# Patient Record
Sex: Female | Born: 1960 | Race: Black or African American | Hispanic: No | Marital: Single | Smoking: Never smoker
Health system: Southern US, Community
[De-identification: ages and names within clinical notes are randomized; demographics above are authoritative.]

## PROBLEM LIST (undated history)

## (undated) DIAGNOSIS — E119 Type 2 diabetes mellitus without complications: Secondary | ICD-10-CM

## (undated) DIAGNOSIS — E785 Hyperlipidemia, unspecified: Secondary | ICD-10-CM

## (undated) DIAGNOSIS — I6522 Occlusion and stenosis of left carotid artery: Secondary | ICD-10-CM

## (undated) DIAGNOSIS — I10 Essential (primary) hypertension: Secondary | ICD-10-CM

## (undated) HISTORY — DX: Type 2 diabetes mellitus without complications: E11.9

## (undated) HISTORY — PX: ABDOMINAL HYSTERECTOMY: SUR658

## (undated) HISTORY — DX: Essential (primary) hypertension: I10

## (undated) HISTORY — DX: Occlusion and stenosis of left carotid artery: I65.22

## (undated) HISTORY — DX: Hyperlipidemia, unspecified: E78.5

---

## 2015-06-28 ENCOUNTER — Encounter: Payer: Self-pay | Admitting: Neurology

## 2015-06-28 ENCOUNTER — Ambulatory Visit (INDEPENDENT_AMBULATORY_CARE_PROVIDER_SITE_OTHER): Payer: Commercial Managed Care - PPO | Admitting: Neurology

## 2015-06-28 VITALS — BP 120/82 | HR 66 | Ht 67.0 in | Wt 205.0 lb

## 2015-06-28 DIAGNOSIS — R42 Dizziness and giddiness: Secondary | ICD-10-CM | POA: Diagnosis not present

## 2015-06-28 DIAGNOSIS — I10 Essential (primary) hypertension: Secondary | ICD-10-CM | POA: Insufficient documentation

## 2015-06-28 NOTE — Progress Notes (Signed)
See consult note

## 2015-06-28 NOTE — Patient Instructions (Signed)
It is not vertigo.  The symptoms do not sound neurologic.  Symptoms of lightheadedness and feeling of going to pass out is typically not neurologic.  I would recommend that Dr. Lysle Rubens investigate other causes.

## 2015-06-28 NOTE — Consult Note (Signed)
NEUROLOGY CONSULTATION NOTE  Ariel Taylor MRN: [id number] DOB: [birth year]  Referring provider: Dr. Lysle Rubens Primary care provider: Dr. Lysle Rubens  Reason for consult:  dizziness  HISTORY OF PRESENT ILLNESS: Ariel Taylor is a 54 year old right-handed female with hypertension who presents for dizziness.  Symptoms started about a year ago.  At that time, she was diagnosed with vertigo and was prescribed meclizine.  It is ineffective.  She reports episodes with sudden onset feeling that she is going to pass out.  Except for a couple of brief instances in the car, there is no associated spinning sensation.  It is not positional and will occur spontaneously.  There is no diaphoresis, palpitations, tunnel vision, headache or focal numbness or weakness.  It occurs off an on for a period of a week.  Over the past year, it has occurred about 5 or 6 times.  Sometimes, she notes a pressure behind the right side of her head, but it is not associated with the spells.  She also reports ringing in her ears at times.  PAST MEDICAL HISTORY: Past Medical History  Diagnosis Date  . Hypertension     PAST SURGICAL HISTORY: Past Surgical History  Procedure Laterality Date  . Abdominal hysterectomy      MEDICATIONS: No current outpatient prescriptions on file prior to visit.   No current facility-administered medications on file prior to visit.    ALLERGIES: Allergies  Allergen Reactions  . Acetaminophen     FAMILY HISTORY: Family History  Problem Relation Age of Onset  . Diabetes      SOCIAL HISTORY: Social History   Social History  . Marital Status: Single    Spouse Name: N/A  . Number of Children: N/A  . Years of Education: N/A   Occupational History  . Not on file.   Social History Main Topics  . Smoking status: Never Smoker   . Smokeless tobacco: Never Used  . Alcohol Use: No  . Drug Use: No  . Sexual Activity: Not on file   Other Topics Concern  . Not on file    Social History Narrative  . No narrative on file    REVIEW OF SYSTEMS: Constitutional: No fevers, chills, or sweats, no generalized fatigue, change in appetite Eyes: No visual changes, double vision, eye pain Ear, nose and throat: No hearing loss, ear pain, nasal congestion, sore throat Cardiovascular: No chest pain, palpitations Respiratory:  No shortness of breath at rest or with exertion, wheezes GastrointestinaI: No nausea, vomiting, diarrhea, abdominal pain, fecal incontinence Genitourinary:  No dysuria, urinary retention or frequency Musculoskeletal:  No neck pain, back pain Integumentary: No rash, pruritus, skin lesions Neurological: as above Psychiatric: No depression, insomnia, anxiety Endocrine: No palpitations, fatigue, diaphoresis, mood swings, change in appetite, change in weight, increased thirst Hematologic/Lymphatic:  No anemia, purpura, petechiae. Allergic/Immunologic: no itchy/runny eyes, nasal congestion, recent allergic reactions, rashes  PHYSICAL EXAM: Filed Vitals:   06/28/15 0752  BP: 120/82  Pulse: 66  Supine 128/80, HR 76bpm; Sitting 120/80, 82bpm; Standing 128/84, 84bpm General: No acute distress.  Patient appears well-groomed. Head:  Normocephalic/atraumatic Eyes:  fundi unremarkable, without vessel changes, exudates, hemorrhages or papilledema. Neck: supple, no paraspinal tenderness, full range of motion Back: No paraspinal tenderness Heart: regular rate and rhythm Lungs: Clear to auscultation bilaterally. Vascular: No carotid bruits. Neurological Exam: Mental status: alert and oriented to person, place, and time, recent and remote memory intact, fund of knowledge intact, attention and concentration intact, speech fluent and not dysarthric,  language intact. Cranial nerves: CN I: not tested CN II: pupils equal, round and reactive to light, visual fields intact, fundi unremarkable, without vessel changes, exudates, hemorrhages or papilledema. CN  III, IV, VI:  full range of motion, no nystagmus, no ptosis CN V: facial sensation intact CN VII: upper and lower face symmetric CN VIII: hearing intact CN IX, X: gag intact, uvula midline CN XI: sternocleidomastoid and trapezius muscles intact CN XII: tongue midline Bulk & Tone: normal, no fasciculations. Motor:  5/5 throughout Sensation: temperature and vibration sensation intact. Deep Tendon Reflexes:  2+ throughout, toes downgoing.  Finger to nose testing:  Without dysmetria.  Heel to shin:  Without dysmetria.  Gait:  Normal station and stride.  Able to turn and tandem walk. Romberg negative.  IMPRESSION: Lightheadedness/pre-syncope.  Symptoms do not sound neurologic.  She does not describe any focal neurologic symptoms associated with spells. Her exam is normal.  Orthostatics are negative.  It is not vertigo.  It does not seem like seizure or migraine.  She reports separate posterior head pressure at times, which are separate from her spells and they really sound like tension-type rather than intracranial abnormality.  I do not suspect cerebrovascular etiology.  I have no neurologic explanation for her symptoms and her symptoms do not help direct me in looking for a neurologic etiology.  Therefore, I have no cause, based on her symptoms and exam, to pursue further testing such as brain MRI.  I would recommend focusing investigation on other causes, such as cardiac.  Thank you for allowing me to take part in the care of this patient.  45 minutes spent face to face with patient, over 50% spent discussing diagnosis.  Metta Clines, DO  CC:  Wenda Low, MD

## 2015-06-30 ENCOUNTER — Ambulatory Visit: Payer: Self-pay | Admitting: Neurology

## 2016-05-17 ENCOUNTER — Other Ambulatory Visit: Payer: Self-pay | Admitting: Internal Medicine

## 2016-05-17 DIAGNOSIS — Z1231 Encounter for screening mammogram for malignant neoplasm of breast: Secondary | ICD-10-CM

## 2016-05-26 ENCOUNTER — Ambulatory Visit: Payer: Commercial Managed Care - PPO

## 2016-06-05 ENCOUNTER — Ambulatory Visit: Payer: Commercial Managed Care - PPO

## 2016-06-15 ENCOUNTER — Ambulatory Visit
Admission: RE | Admit: 2016-06-15 | Discharge: 2016-06-15 | Disposition: A | Discharge status: Home / Self Care | Payer: Commercial Managed Care - PPO | Source: Ambulatory Visit | Attending: Internal Medicine | Admitting: Internal Medicine

## 2016-06-15 DIAGNOSIS — Z1231 Encounter for screening mammogram for malignant neoplasm of breast: Secondary | ICD-10-CM

## 2018-01-07 ENCOUNTER — Other Ambulatory Visit: Payer: Self-pay | Admitting: Internal Medicine

## 2018-01-07 DIAGNOSIS — Z1231 Encounter for screening mammogram for malignant neoplasm of breast: Secondary | ICD-10-CM

## 2018-01-30 ENCOUNTER — Ambulatory Visit
Admission: RE | Admit: 2018-01-30 | Discharge: 2018-01-30 | Disposition: A | Discharge status: Home / Self Care | Payer: Commercial Managed Care - PPO | Source: Ambulatory Visit | Attending: Internal Medicine | Admitting: Internal Medicine

## 2018-01-30 DIAGNOSIS — Z1231 Encounter for screening mammogram for malignant neoplasm of breast: Secondary | ICD-10-CM | POA: Diagnosis not present

## 2018-05-06 DIAGNOSIS — Z1159 Encounter for screening for other viral diseases: Secondary | ICD-10-CM | POA: Diagnosis not present

## 2018-05-06 DIAGNOSIS — Z1389 Encounter for screening for other disorder: Secondary | ICD-10-CM | POA: Diagnosis not present

## 2018-05-06 DIAGNOSIS — E7800 Pure hypercholesterolemia, unspecified: Secondary | ICD-10-CM | POA: Diagnosis not present

## 2018-05-06 DIAGNOSIS — E1169 Type 2 diabetes mellitus with other specified complication: Secondary | ICD-10-CM | POA: Diagnosis not present

## 2018-05-06 DIAGNOSIS — Z23 Encounter for immunization: Secondary | ICD-10-CM | POA: Diagnosis not present

## 2018-05-06 DIAGNOSIS — Z0000 Encounter for general adult medical examination without abnormal findings: Secondary | ICD-10-CM | POA: Diagnosis not present

## 2018-07-15 DIAGNOSIS — M1712 Unilateral primary osteoarthritis, left knee: Secondary | ICD-10-CM | POA: Diagnosis not present

## 2018-07-15 DIAGNOSIS — M25562 Pain in left knee: Secondary | ICD-10-CM | POA: Diagnosis not present

## 2018-08-05 DIAGNOSIS — M25562 Pain in left knee: Secondary | ICD-10-CM | POA: Diagnosis not present

## 2018-11-07 ENCOUNTER — Other Ambulatory Visit: Payer: Self-pay | Admitting: Internal Medicine

## 2019-06-12 ENCOUNTER — Other Ambulatory Visit: Payer: Self-pay | Admitting: Internal Medicine

## 2019-06-12 DIAGNOSIS — Z1231 Encounter for screening mammogram for malignant neoplasm of breast: Secondary | ICD-10-CM

## 2019-06-13 ENCOUNTER — Other Ambulatory Visit: Payer: Self-pay

## 2019-06-13 ENCOUNTER — Ambulatory Visit
Admission: RE | Admit: 2019-06-13 | Discharge: 2019-06-13 | Disposition: A | Discharge status: Home / Self Care | Payer: Commercial Managed Care - PPO | Source: Ambulatory Visit | Attending: Internal Medicine | Admitting: Internal Medicine

## 2019-06-13 DIAGNOSIS — Z1231 Encounter for screening mammogram for malignant neoplasm of breast: Secondary | ICD-10-CM

## 2019-11-22 ENCOUNTER — Ambulatory Visit: Payer: Commercial Managed Care - PPO | Attending: Internal Medicine

## 2019-11-22 DIAGNOSIS — Z23 Encounter for immunization: Secondary | ICD-10-CM | POA: Insufficient documentation

## 2019-11-22 NOTE — Progress Notes (Signed)
   Covid-19 Vaccination Clinic  Name:  Ariel Taylor    MRN: [id number] DOB: 1961/06/22  11/22/2019  [name] was observed post Covid-19 immunization for 15 minutes without incidence. She was provided with Vaccine Information Sheet and instruction to access the V-Safe system.   [name] was instructed to call 911 with any severe reactions post vaccine: Marland Kitchen Difficulty breathing  . Swelling of your face and throat  . A fast heartbeat  . A bad rash all over your body  . Dizziness and weakness    Immunizations Administered    Name Date Dose VIS Date Route   Pfizer COVID-19 Vaccine 11/22/2019  6:45 PM 0.3 mL 09/05/2019 Intramuscular   Manufacturer: [place]   Lot: [id number]   [place]: [id number]

## 2019-12-13 ENCOUNTER — Ambulatory Visit: Payer: Commercial Managed Care - PPO | Attending: Internal Medicine

## 2019-12-13 DIAGNOSIS — Z23 Encounter for immunization: Secondary | ICD-10-CM

## 2019-12-13 NOTE — Progress Notes (Signed)
   Covid-19 Vaccination Clinic  Name:  Ariel Taylor    MRN: [id number] DOB: Jul 07, 1961  12/13/2019  [name] was observed post Covid-19 immunization for 15 minutes without incident. She was provided with Vaccine Information Sheet and instruction to access the V-Safe system.   [name] was instructed to call 911 with any severe reactions post vaccine: Marland Kitchen Difficulty breathing  . Swelling of face and throat  . A fast heartbeat  . A bad rash all over body  . Dizziness and weakness   Immunizations Administered    Name Date Dose VIS Date Route   Pfizer COVID-19 Vaccine 12/13/2019  4:47 PM 0.3 mL 09/05/2019 Intramuscular   Manufacturer: [place]   Lot: [id number]   [place]: [id number]

## 2020-12-30 ENCOUNTER — Other Ambulatory Visit: Payer: Self-pay | Admitting: Internal Medicine

## 2020-12-30 DIAGNOSIS — Z1231 Encounter for screening mammogram for malignant neoplasm of breast: Secondary | ICD-10-CM

## 2021-02-22 ENCOUNTER — Other Ambulatory Visit: Payer: Self-pay

## 2021-02-22 ENCOUNTER — Ambulatory Visit
Admission: RE | Admit: 2021-02-22 | Discharge: 2021-02-22 | Disposition: A | Discharge status: Home / Self Care | Payer: Commercial Managed Care - PPO | Source: Ambulatory Visit | Attending: Internal Medicine | Admitting: Internal Medicine

## 2021-02-22 DIAGNOSIS — Z1231 Encounter for screening mammogram for malignant neoplasm of breast: Secondary | ICD-10-CM

## 2021-03-01 ENCOUNTER — Other Ambulatory Visit: Payer: Self-pay

## 2021-03-01 ENCOUNTER — Ambulatory Visit: Payer: Commercial Managed Care - PPO | Admitting: Cardiology

## 2021-03-01 ENCOUNTER — Encounter: Payer: Self-pay | Admitting: Cardiology

## 2021-03-01 VITALS — BP 150/71 | HR 74 | Temp 98.1°F | Resp 17 | Ht 67.0 in | Wt 187.8 lb

## 2021-03-01 DIAGNOSIS — E782 Mixed hyperlipidemia: Secondary | ICD-10-CM

## 2021-03-01 DIAGNOSIS — I10 Essential (primary) hypertension: Secondary | ICD-10-CM

## 2021-03-01 DIAGNOSIS — R0989 Other specified symptoms and signs involving the circulatory and respiratory systems: Secondary | ICD-10-CM

## 2021-03-01 DIAGNOSIS — R42 Dizziness and giddiness: Secondary | ICD-10-CM

## 2021-03-01 DIAGNOSIS — R002 Palpitations: Secondary | ICD-10-CM

## 2021-03-01 DIAGNOSIS — R9431 Abnormal electrocardiogram [ECG] [EKG]: Secondary | ICD-10-CM

## 2021-03-01 DIAGNOSIS — E119 Type 2 diabetes mellitus without complications: Secondary | ICD-10-CM

## 2021-03-01 NOTE — Progress Notes (Signed)
Date:  03/01/2021   ID:  Ariel Taylor, DOB 1961/03/18, MRN [id number]  PCP:  Wenda Low, MD  Cardiologist:  Rex Kras, DO, [facility] (established care March 01, 2021)  REASON FOR CONSULT: Palpitations  REQUESTING PHYSICIAN:  Wenda Low, MD [address] [place],  [place] [address]  Chief Complaint  Patient presents with  . New Patient (Initial Visit)  . Dizziness  . Palpitations    Ref by Wenda Low    HPI  Ariel Taylor is a 60 y.o. female who presents to the office with a chief complaint of " palpitations, dizziness." Patient's past medical history and cardiovascular risk factors include: Benign essential hypertension, hyperlipidemia, non-insulin-dependent diabetes mellitus type 2, postmenopausal female.   She is referred to the office at the request of Wenda Low, MD for evaluation of palpitations and dizziness.  Patient states that she is here to be evaluated for her ongoing dizziness and palpitations.  Patient plans to move to [place] and would like to complete the work-up prior to her moving.  Dizziness: Symptoms have been going on for at least 1 year, intermittent, occurs at least couple times a week and lasts for a few seconds.  They can occur while at rest, moving or changing positions.  No syncopal events.  Patient states that she does have a history of vertigo but the symptoms are not consistent with her known vertigo.  No improving or worsening factors.  No additional work-up performed prior to establishing care with myself.  Palpitations: Patient states that they have been occurring separately, lasts for few seconds, occurring once a month, not associated with dizziness or passing out.  No improving or worsening factors.  Patient is caffeine consumption includes half a cup of coffee on a daily basis, half cup of green tea, and soda on as needed basis. No use of new over-the-counter medications, weight loss supplements, stimulants,  recreational drugs, marijuana, or energy drinks.  She takes both of her antihypertensive medications at night.  Recently diagnosed with diabetes and started on metformin and does not have any GI upset.  No family history of premature coronary disease or sudden cardiac death.  FUNCTIONAL STATUS: Golfs 5 days a week and also plays tennis 1 day a week.  Patient stated she tries to walk the golf course on a daily basis.  ALLERGIES: Allergies  Allergen Reactions  . Acetaminophen     MEDICATION LIST PRIOR TO VISIT: Current Meds  Medication Sig  . amLODipine (NORVASC) 5 MG tablet Take 5 mg by mouth daily.  Marland Kitchen lisinopril (PRINIVIL,ZESTRIL) 10 MG tablet Take 10 mg by mouth daily.  . metFORMIN (GLUCOPHAGE) 500 MG tablet Take 1 tablet by mouth 2 (two) times daily.  . rosuvastatin (CRESTOR) 5 MG tablet Take 5 mg by mouth daily.     PAST MEDICAL HISTORY: Past Medical History:  Diagnosis Date  . Diabetes mellitus without complication ([place])   . Hyperlipidemia   . Hypertension     PAST SURGICAL HISTORY: Past Surgical History:  Procedure Laterality Date  . ABDOMINAL HYSTERECTOMY      FAMILY HISTORY: The patient family history includes Diabetes in her mother and another family member.  SOCIAL HISTORY:  The patient  reports that she has never smoked. She has never used smokeless tobacco. She reports that she does not drink alcohol and does not use drugs.  REVIEW OF SYSTEMS: Review of Systems  Constitutional: Negative for chills and fever.  HENT: Negative for hoarse voice and nosebleeds.  Eyes: Negative for discharge, double vision and pain.  Cardiovascular: Positive for palpitations. Negative for chest pain, claudication, dyspnea on exertion, leg swelling, near-syncope, orthopnea, paroxysmal nocturnal dyspnea and syncope.  Respiratory: Negative for hemoptysis and shortness of breath.   Musculoskeletal: Negative for muscle cramps and myalgias.  Gastrointestinal: Negative for abdominal  pain, constipation, diarrhea, hematemesis, hematochezia, melena, nausea and vomiting.  Neurological: Positive for dizziness. Negative for light-headedness.    PHYSICAL EXAM: Vitals with BMI 03/01/2021 06/28/2015  Height 5\' 7"  5\' 7"   Weight 187 lbs 13 oz 205 lbs  BMI 03.50 09.3  Systolic 818 299  Diastolic 71 82  Pulse 74 66   Orthostatic VS for the past 72 hrs (Last 3 readings):  Orthostatic BP Patient Position BP Location Cuff Size Orthostatic Pulse  03/01/21 1148 143/81 Standing Left Arm Large 93  03/01/21 1147 150/72 Sitting Left Arm Large 86  03/01/21 1144 156/77 Supine Left Arm Large 84   CONSTITUTIONAL: Well-developed and well-nourished. No acute distress.  SKIN: Skin is warm and dry. No rash noted. No cyanosis. No pallor. No jaundice HEAD: Normocephalic and atraumatic.  EYES: No scleral icterus MOUTH/THROAT: Moist oral membranes.  NECK: No JVD present. No thyromegaly noted. Left carotid bruits  LYMPHATIC: No visible cervical adenopathy.  CHEST Normal respiratory effort. No intercostal retractions  LUNGS: Clear to auscultation bilaterally.  No stridor. No wheezes. No rales.  CARDIOVASCULAR: Regular rate and rhythm, positive S1-S2, no murmurs rubs or gallops appreciated ABDOMINAL: No apparent ascites.  EXTREMITIES: No peripheral edema  HEMATOLOGIC: No significant bruising NEUROLOGIC: Oriented to person, place, and time. Nonfocal. Normal muscle tone.  PSYCHIATRIC: Normal mood and affect. Normal behavior. Cooperative  CARDIAC DATABASE: EKG: 03/01/2021: Normal sinus rhythm, 83 bpm, normal axis, nonspecific ST-T changes, left atrial enlargement, without underlying injury pattern.   Echocardiogram: No results found for this or any previous visit from the past 1095 days.   Stress Testing: No results found for this or any previous visit from the past 1095 days.  Heart Catheterization: None   LABORATORY DATA: No flowsheet data found.  No flowsheet data found.  Lipid Panel   No results found for: CHOL, TRIG, HDL, CHOLHDL, VLDL, LDLCALC, LDLDIRECT, LABVLDL  No components found for: NTPROBNP No results for input(s): PROBNP in the last 8760 hours. No results for input(s): TSH in the last 8760 hours.  BMP No results for input(s): NA, K, CL, CO2, GLUCOSE, BUN, CREATININE, CALCIUM, GFRNONAA, GFRAA in the last 8760 hours.  HEMOGLOBIN A1C No results found for: HGBA1C, MPG  IMPRESSION:    ICD-10-CM   1. Palpitations  R00.2 EKG 12-Lead  2. Dizziness  R42   3. Left carotid bruit  R09.89 PCV CAROTID DUPLEX (BILATERAL)  4. Non-insulin dependent type 2 diabetes mellitus ([place])  E11.9 PCV MYOCARDIAL PERFUSION WO LEXISCAN  5. Essential hypertension  I10 PCV ECHOCARDIOGRAM COMPLETE    PCV MYOCARDIAL PERFUSION WO LEXISCAN  6. Mixed hyperlipidemia  E78.2   7. Abnormal EKG  R94.31 PCV MYOCARDIAL PERFUSION WO LEXISCAN     RECOMMENDATIONS: Judiann Celia is a 60 y.o. female whose past medical history and cardiac risk factors include: Benign essential hypertension, hyperlipidemia, non-insulin-dependent diabetes mellitus type 2, postmenopausal female.   Palpitations/dizziness: Present but relatively stable. Occurs sporadically We did discuss proceeding with a monitor for 14 days but since her episodes are so sporadic it may not be beneficial unless if her symptoms worsen.  Patient is agreeable and would like to hold off on the monitor for now.  Abnormal EKG:  EKG shows normal sinus rhythm with diffuse ST-T changes most likely secondary to left ventricular hypertrophy but underlying ischemia cannot be ruled out.   Discussed findings with the patient at today's visit and given her multiple cardiovascular risk factors including diabetes we discussed proceeding with an ischemic evaluation.   Echocardiogram will be ordered to evaluate for structural heart disease and left ventricular systolic function.   Plan exercise nuclear stress test.  Exercise nuclear stress test is  preferred given baseline ST-T changes.  Left carotid bruit: Check carotid duplex to evaluate for carotid artery atherosclerosis/stenosis  Benign essential hypertension: Currently being managed by primary care provider. Patient states that she takes all her antihypertensive medications at night. I have asked her to take lisinopril in the morning and amlodipine at night.  She is an avid Medical illustrator and I have encouraged her to keep her self well-hydrated to prevent episodes of dizziness which may be secondary to dehydration.  Continue to monitor for now.  Patient was recently diagnosed with diabetes mellitus type 2 and was started on metformin and statin therapy.  From a cardiovascular standpoint encouraged improving her modifiable cardiovascular risk factors including better blood pressure management, glycemic and lipid management, and continuing her physical activity of moderate intensity for at least 30 minutes a day 5 days a week as tolerated.  FINAL MEDICATION LIST END OF ENCOUNTER: No orders of the defined types were placed in this encounter.   There are no discontinued medications.   Current Outpatient Medications:  .  amLODipine (NORVASC) 5 MG tablet, Take 5 mg by mouth daily., Disp: , Rfl:  .  lisinopril (PRINIVIL,ZESTRIL) 10 MG tablet, Take 10 mg by mouth daily., Disp: , Rfl:  .  metFORMIN (GLUCOPHAGE) 500 MG tablet, Take 1 tablet by mouth 2 (two) times daily., Disp: , Rfl:  .  rosuvastatin (CRESTOR) 5 MG tablet, Take 5 mg by mouth daily., Disp: , Rfl:   Orders Placed This Encounter  Procedures  . PCV MYOCARDIAL PERFUSION WO LEXISCAN  . EKG 12-Lead  . PCV ECHOCARDIOGRAM COMPLETE  . PCV CAROTID DUPLEX (BILATERAL)    There are no Patient Instructions on file for this visit.   --Continue cardiac medications as reconciled in final medication list. --Return in about 3 weeks (around 03/22/2021) for Follow up, Palpitations, Review test results. Or sooner if  needed. --Continue follow-up with your primary care physician regarding the management of your other chronic comorbid conditions.  Patient's questions and concerns were addressed to her satisfaction. She voices understanding of the instructions provided during this encounter.   This note was created using a voice recognition software as a result there may be grammatical errors inadvertently enclosed that do not reflect the nature of this encounter. Every attempt is made to correct such errors.  Rex Kras, [place], [facility]  Pager: [phone] Office: [phone]

## 2021-03-10 ENCOUNTER — Other Ambulatory Visit: Payer: Self-pay

## 2021-03-10 ENCOUNTER — Ambulatory Visit: Payer: Commercial Managed Care - PPO

## 2021-03-10 DIAGNOSIS — E119 Type 2 diabetes mellitus without complications: Secondary | ICD-10-CM

## 2021-03-10 DIAGNOSIS — I10 Essential (primary) hypertension: Secondary | ICD-10-CM

## 2021-03-10 DIAGNOSIS — R9431 Abnormal electrocardiogram [ECG] [EKG]: Secondary | ICD-10-CM

## 2021-03-10 DIAGNOSIS — R0989 Other specified symptoms and signs involving the circulatory and respiratory systems: Secondary | ICD-10-CM

## 2021-03-21 ENCOUNTER — Other Ambulatory Visit: Payer: Commercial Managed Care - PPO

## 2021-03-21 NOTE — Progress Notes (Signed)
Called pt no answer, left a vm to return the call back

## 2021-03-21 NOTE — Progress Notes (Signed)
Spoke to patient she voiced understanding

## 2021-03-23 ENCOUNTER — Ambulatory Visit: Payer: Commercial Managed Care - PPO

## 2021-03-23 ENCOUNTER — Other Ambulatory Visit: Payer: Self-pay

## 2021-03-30 NOTE — Progress Notes (Signed)
Called patient, NA, LMAM

## 2021-03-31 ENCOUNTER — Telehealth: Payer: Self-pay | Admitting: Hematology and Oncology

## 2021-03-31 NOTE — Telephone Encounter (Signed)
Received a new hem referral from Dr. Lysle Rubens for elevated platelets and anemia. Ariel Taylor returned my call and has been scheduled to see Dr. Chryl Heck on 7/13 at 10:40am. Pt aware to arrive 20 minutes early.

## 2021-03-31 NOTE — Progress Notes (Signed)
Pt aware.

## 2021-04-04 ENCOUNTER — Ambulatory Visit: Payer: Commercial Managed Care - PPO | Admitting: Cardiology

## 2021-04-05 ENCOUNTER — Other Ambulatory Visit: Payer: Self-pay

## 2021-04-05 ENCOUNTER — Ambulatory Visit: Payer: Commercial Managed Care - PPO | Admitting: Cardiology

## 2021-04-05 ENCOUNTER — Encounter: Payer: Self-pay | Admitting: Cardiology

## 2021-04-05 VITALS — BP 154/80 | HR 114 | Temp 98.2°F | Resp 16 | Ht 67.0 in | Wt 187.8 lb

## 2021-04-05 DIAGNOSIS — E119 Type 2 diabetes mellitus without complications: Secondary | ICD-10-CM

## 2021-04-05 DIAGNOSIS — E782 Mixed hyperlipidemia: Secondary | ICD-10-CM

## 2021-04-05 DIAGNOSIS — R9431 Abnormal electrocardiogram [ECG] [EKG]: Secondary | ICD-10-CM

## 2021-04-05 DIAGNOSIS — R42 Dizziness and giddiness: Secondary | ICD-10-CM

## 2021-04-05 DIAGNOSIS — R002 Palpitations: Secondary | ICD-10-CM

## 2021-04-05 DIAGNOSIS — R0989 Other specified symptoms and signs involving the circulatory and respiratory systems: Secondary | ICD-10-CM

## 2021-04-05 DIAGNOSIS — I6522 Occlusion and stenosis of left carotid artery: Secondary | ICD-10-CM

## 2021-04-05 NOTE — Progress Notes (Signed)
Date:  04/05/2021   ID:  [name], DOB Jul 26, 1961, MRN [id number]  PCP:  Wenda Low, MD  Cardiologist:  Rex Kras, DO, [facility] (established care March 01, 2021)  Date: 04/05/21 Last Office Visit: 03/01/2021  Chief Complaint  Patient presents with   Palpitations   Results   Follow-up    5 weeks      HPI  Ariel Taylor is a 60 y.o. female who presents to the office with a chief complaint of " reevaluate symptoms of palpitation and discuss test results." Patient's past medical history and cardiovascular risk factors include: Benign essential hypertension, hyperlipidemia, non-insulin-dependent diabetes mellitus type 2, postmenopausal female.   She is referred to the office at the request of Wenda Low, MD for evaluation of palpitations and dizziness.  Patient initially presented to the office for evaluation of palpitations and dizziness back in June 2022.  Clinically patient's palpitations had overall improved and were infrequent and therefore the shared decision was to hold off on proceeding with a Holter monitor as it would be overall a low yield study.  Since last visit patient states that the symptoms of palpitation are essentially resolved and very infrequently present.  With regards to dizziness patient's orthostatic vital signs were negative.  I encouraged her to take some of her antihypertensive medications in the morning and others at night.  Since then patient states that her symptoms of dizziness has also essentially resolved.  She brings in her home blood pressure log for review.  Her systolic blood pressures are ranging around 125-135 mmHg.  She was also noted to have a left carotid bruit and underwent a carotid duplex and notes very mild atherosclerotic burden involving the left ICA.  Given her new diagnosis of diabetes, EKG findings and other cardiovascular risk factors she underwent an echocardiogram and stress test.  Echocardiogram notes preserved LVEF and  stress test overall a low risk study.  Details reviewed with her in great detail and noted below for further reference.  Patient denies any active chest pain or shortness of breath at rest or with effort related activities.  No recent hospitalizations for cardiovascular symptoms.  No family history of premature coronary disease or sudden cardiac death.  FUNCTIONAL STATUS: Golfs 5 days a week and also plays tennis 1 day a week.  Patient stated she tries to walk the golf course on a daily basis.  ALLERGIES: Allergies  Allergen Reactions   Acetaminophen     MEDICATION LIST PRIOR TO VISIT: Current Meds  Medication Sig   amLODipine (NORVASC) 5 MG tablet Take 5 mg by mouth daily.   lisinopril (PRINIVIL,ZESTRIL) 10 MG tablet Take 10 mg by mouth daily.   metFORMIN (GLUCOPHAGE) 500 MG tablet Take 1 tablet by mouth 2 (two) times daily.   rosuvastatin (CRESTOR) 5 MG tablet Take 5 mg by mouth daily.     PAST MEDICAL HISTORY: Past Medical History:  Diagnosis Date   Diabetes mellitus without complication ([place])    Hyperlipidemia    Hypertension     PAST SURGICAL HISTORY: Past Surgical History:  Procedure Laterality Date   ABDOMINAL HYSTERECTOMY      FAMILY HISTORY: The patient family history includes Diabetes in her mother and another family member.  SOCIAL HISTORY:  The patient  reports that she has never smoked. She has never used smokeless tobacco. She reports that she does not drink alcohol and does not use drugs.  REVIEW OF SYSTEMS: Review of Systems  Constitutional: Negative for chills and fever.  HENT:  Negative for hoarse voice and nosebleeds.   Eyes:  Negative for discharge, double vision and pain.  Cardiovascular:  Positive for palpitations (stable). Negative for chest pain, claudication, dyspnea on exertion, leg swelling, near-syncope, orthopnea, paroxysmal nocturnal dyspnea and syncope.  Respiratory:  Negative for hemoptysis and shortness of breath.   Musculoskeletal:   Negative for muscle cramps and myalgias.  Gastrointestinal:  Negative for abdominal pain, constipation, diarrhea, hematemesis, hematochezia, melena, nausea and vomiting.  Neurological:  Negative for dizziness and light-headedness.   PHYSICAL EXAM: Vitals with BMI 04/05/2021 04/05/2021 03/01/2021  Height - 5\' 7"  5\' 7"   Weight - 187 lbs 13 oz 187 lbs 13 oz  BMI - 09.60 45.40  Systolic 981 191 478  Diastolic 80 77 71  Pulse 295 111 74   CONSTITUTIONAL: Well-developed and well-nourished. No acute distress.  SKIN: Skin is warm and dry. No rash noted. No cyanosis. No pallor. No jaundice HEAD: Normocephalic and atraumatic.  EYES: No scleral icterus MOUTH/THROAT: Moist oral membranes.  NECK: No JVD present. No thyromegaly noted. Left carotid bruits  LYMPHATIC: No visible cervical adenopathy.  CHEST Normal respiratory effort. No intercostal retractions  LUNGS: Clear to auscultation bilaterally.  No stridor. No wheezes. No rales.  CARDIOVASCULAR: Regular rate and rhythm, positive S1-S2, no murmurs rubs or gallops appreciated ABDOMINAL: No apparent ascites.  EXTREMITIES: No peripheral edema  HEMATOLOGIC: No significant bruising NEUROLOGIC: Oriented to person, place, and time. Nonfocal. Normal muscle tone.  PSYCHIATRIC: Normal mood and affect. Normal behavior. Cooperative  CARDIAC DATABASE: EKG: 03/01/2021: Normal sinus rhythm, 83 bpm, normal axis, nonspecific ST-T changes, left atrial enlargement, without underlying injury pattern.   Echocardiogram: 03/10/2021:  Normal LV systolic function with visual EF 60-65%. Left ventricle cavity is normal in size. Mild left ventricular hypertrophy. Normal global wall motion. Unable to evaluate diastolic function.  Left atrial cavity is mildly dilated.  No prior study for comparison.   Stress Testing: Exercise Sestamibi stress test 03/23/2021: Exercise nuclear stress test was performed using Bruce protocol. Patient reached 7.9 METS, and 89% of age  predicted maximum heart rate. Exercise capacity was fair. No chest pain reported. Heart rate and hemodynamic response were normal. Stress EKG revealed equivocal ischemic changes. Normal myocardial perfusion. Stress LVEF 82%. Low risk study.  Heart Catheterization: None   Carotid artery duplex 03/10/2021:  Doppler velocity suggests stenosis in the right external carotid artery (<50%).  Doppler velocity suggests stenosis in the left internal carotid artery  (1-15%).  Minimal plaque noted.  Doppler velocity suggests stenosis in the left external carotid artery (<50%).  Antegrade right vertebral artery flow. Antegrade left vertebral artery flow.  External carotid artery stenosis may be the source of bruit. Follow up is appropriate if clinically indicated.  LABORATORY DATA: No flowsheet data found.  No flowsheet data found.  Lipid Panel  No results found for: CHOL, TRIG, HDL, CHOLHDL, VLDL, LDLCALC, LDLDIRECT, LABVLDL  No components found for: NTPROBNP No results for input(s): PROBNP in the last 8760 hours. No results for input(s): TSH in the last 8760 hours.  BMP No results for input(s): NA, K, CL, CO2, GLUCOSE, BUN, CREATININE, CALCIUM, GFRNONAA, GFRAA in the last 8760 hours.  HEMOGLOBIN A1C No results found for: HGBA1C, MPG  IMPRESSION:    ICD-10-CM   1. Palpitations  R00.2     2. Dizziness  R42     3. Mild atherosclerosis of carotid artery, left  I65.22 PCV CAROTID DUPLEX (BILATERAL)    4. Non-insulin dependent type 2 diabetes mellitus ([place])  E11.9     5. Mixed hyperlipidemia  E78.2     6. Abnormal EKG  R94.31        RECOMMENDATIONS: Yula Crotwell is a 60 y.o. female whose past medical history and cardiac risk factors include: Benign essential hypertension, hyperlipidemia, non-insulin-dependent diabetes mellitus type 2, postmenopausal female.   Palpitations/dizziness: Infrequently present, almost resolved Shared decision is to hold off on proceeding with a  Holter monitor at this time as the symptoms are very infrequent.  Abnormal EKG: Given her EKG findings and multiple cardiovascular risk factors including new onset of diabetes mellitus patient underwent an ischemic evaluation overall unremarkable results were reviewed with her in great detail and noted above for further reference. No additional testing warranted at this time. She is encouraged to improve her modifiable cardiovascular risk factors which include blood pressure, lipid, and glycemic management.  Left carotid artery atherosclerosis: Noted to have left carotid bruit on physical examination and underwent a carotid duplex. Noted to have very mild atherosclerosis involving the left ICA. She is already on statin therapy given her underlying diabetes. Repeat carotid duplex in 1 year to reevaluate disease progression.  Benign essential hypertension: Office blood pressures are currently not at goal. Home blood pressures are within acceptable range as discussed above. Currently being managed by primary care provider. Recommend a goal blood pressure of 130-80 mmHg if able to tolerate. Her symptoms of dizziness have improved significantly after taking some of her antihypertensive medications in the morning and evening.   Low-salt diet recommended.    I will see the patient on an annual basis or sooner if needed.  Patient is advised to have her yearly physical first and to bring her labs and at the next office visit for review/reference  Patient will be moving to [place] shortly but would like to keep her providers in [place] as she will be coming in to town in the summer months.  FINAL MEDICATION LIST END OF ENCOUNTER: No orders of the defined types were placed in this encounter.   There are no discontinued medications.   Current Outpatient Medications:    amLODipine (NORVASC) 5 MG tablet, Take 5 mg by mouth daily., Disp: , Rfl:    lisinopril (PRINIVIL,ZESTRIL) 10 MG tablet,  Take 10 mg by mouth daily., Disp: , Rfl:    metFORMIN (GLUCOPHAGE) 500 MG tablet, Take 1 tablet by mouth 2 (two) times daily., Disp: , Rfl:    rosuvastatin (CRESTOR) 5 MG tablet, Take 5 mg by mouth daily., Disp: , Rfl:   Orders Placed This Encounter  Procedures   PCV CAROTID DUPLEX (BILATERAL)   There are no Patient Instructions on file for this visit.   --Continue cardiac medications as reconciled in final medication list. --Return for Follow up carotid artery atherosclerosis, bring in your labs from yearly physical. Or sooner if needed. --Continue follow-up with your primary care physician regarding the management of your other chronic comorbid conditions.  Patient's questions and concerns were addressed to her satisfaction. She voices understanding of the instructions provided during this encounter.   This note was created using a voice recognition software as a result there may be grammatical errors inadvertently enclosed that do not reflect the nature of this encounter. Every attempt is made to correct such errors.  Rex Kras, [place], [facility]  Pager: [phone] Office: [phone]

## 2021-04-06 ENCOUNTER — Inpatient Hospital Stay: Payer: 59 | Attending: Hematology and Oncology | Admitting: Hematology and Oncology

## 2021-04-06 ENCOUNTER — Inpatient Hospital Stay: Payer: 59

## 2021-04-06 ENCOUNTER — Encounter: Payer: Self-pay | Admitting: Hematology and Oncology

## 2021-04-06 VITALS — BP 139/81 | HR 91 | Temp 98.6°F | Resp 18 | Wt 181.3 lb

## 2021-04-06 DIAGNOSIS — D75839 Thrombocytosis, unspecified: Secondary | ICD-10-CM

## 2021-04-06 DIAGNOSIS — E785 Hyperlipidemia, unspecified: Secondary | ICD-10-CM | POA: Diagnosis not present

## 2021-04-06 DIAGNOSIS — Z90710 Acquired absence of both cervix and uterus: Secondary | ICD-10-CM | POA: Diagnosis not present

## 2021-04-06 DIAGNOSIS — Z7984 Long term (current) use of oral hypoglycemic drugs: Secondary | ICD-10-CM

## 2021-04-06 DIAGNOSIS — E119 Type 2 diabetes mellitus without complications: Secondary | ICD-10-CM | POA: Diagnosis not present

## 2021-04-06 DIAGNOSIS — D509 Iron deficiency anemia, unspecified: Secondary | ICD-10-CM

## 2021-04-06 DIAGNOSIS — Z79899 Other long term (current) drug therapy: Secondary | ICD-10-CM | POA: Insufficient documentation

## 2021-04-06 DIAGNOSIS — Z833 Family history of diabetes mellitus: Secondary | ICD-10-CM | POA: Diagnosis not present

## 2021-04-06 DIAGNOSIS — I10 Essential (primary) hypertension: Secondary | ICD-10-CM | POA: Insufficient documentation

## 2021-04-06 DIAGNOSIS — D72829 Elevated white blood cell count, unspecified: Secondary | ICD-10-CM

## 2021-04-06 LAB — CBC WITH DIFFERENTIAL/PLATELET
Abs Immature Granulocytes: 0.1 10*3/uL — ABNORMAL HIGH (ref 0.00–0.07)
Basophils Absolute: 0.1 10*3/uL (ref 0.0–0.1)
Basophils Relative: 0 %
Eosinophils Absolute: 0.2 10*3/uL (ref 0.0–0.5)
Eosinophils Relative: 1 %
HCT: 35.5 % — ABNORMAL LOW (ref 36.0–46.0)
Hemoglobin: 11.2 g/dL — ABNORMAL LOW (ref 12.0–15.0)
Immature Granulocytes: 1 %
Lymphocytes Relative: 12 %
Lymphs Abs: 1.7 10*3/uL (ref 0.7–4.0)
MCH: 21.8 pg — ABNORMAL LOW (ref 26.0–34.0)
MCHC: 31.5 g/dL (ref 30.0–36.0)
MCV: 69.2 fL — ABNORMAL LOW (ref 80.0–100.0)
Monocytes Absolute: 1.3 10*3/uL — ABNORMAL HIGH (ref 0.1–1.0)
Monocytes Relative: 9 %
Neutro Abs: 11 10*3/uL — ABNORMAL HIGH (ref 1.7–7.7)
Neutrophils Relative %: 77 %
Platelets: 418 10*3/uL — ABNORMAL HIGH (ref 150–400)
RBC: 5.13 MIL/uL — ABNORMAL HIGH (ref 3.87–5.11)
RDW: 16 % — ABNORMAL HIGH (ref 11.5–15.5)
WBC: 14.3 10*3/uL — ABNORMAL HIGH (ref 4.0–10.5)
nRBC: 0 % (ref 0.0–0.2)

## 2021-04-06 LAB — FERRITIN: Ferritin: 126 ng/mL (ref 11–307)

## 2021-04-06 LAB — IRON AND TIBC
Iron: 17 ug/dL — ABNORMAL LOW (ref 41–142)
Saturation Ratios: 5 % — ABNORMAL LOW (ref 21–57)
TIBC: 330 ug/dL (ref 236–444)
UIBC: 313 ug/dL (ref 120–384)

## 2021-04-06 LAB — COMPREHENSIVE METABOLIC PANEL
ALT: 23 U/L (ref 0–44)
AST: 19 U/L (ref 15–41)
Albumin: 3.8 g/dL (ref 3.5–5.0)
Alkaline Phosphatase: 116 U/L (ref 38–126)
Anion gap: 8 (ref 5–15)
BUN: 12 mg/dL (ref 6–20)
CO2: 28 mmol/L (ref 22–32)
Calcium: 10.1 mg/dL (ref 8.9–10.3)
Chloride: 105 mmol/L (ref 98–111)
Creatinine, Ser: 0.91 mg/dL (ref 0.44–1.00)
GFR, Estimated: >60 mL/min (ref >60)
Glucose, Bld: 124 mg/dL — ABNORMAL HIGH (ref 70–99)
Potassium: 3.8 mmol/L (ref 3.5–5.1)
Sodium: 141 mmol/L (ref 135–145)
Total Bilirubin: 0.4 mg/dL (ref 0.3–1.2)
Total Protein: 8.5 g/dL — ABNORMAL HIGH (ref 6.5–8.1)

## 2021-04-06 LAB — VITAMIN B12: Vitamin B-12: 411 pg/mL (ref 180–914)

## 2021-04-06 NOTE — Progress Notes (Signed)
[facility] NOTE  Patient Care Team: Wenda Low, MD as PCP - General (Internal Medicine)  CHIEF COMPLAINTS/PURPOSE OF CONSULTATION:  Anemia, thrombocytosis.  ASSESSMENT & PLAN:  Microcytic hypochromic anemia This is a very pleasant 60 year old female patient with past medical history significant for diabetes mellitus without complication, hypertension and dyslipidemia referred to hematology for evaluation of anemia and thrombocytosis.  She denies any complaints except for pica.  She has always had anemia and takes an oral iron supplementation from time to time.,  Recently started it back.  No menstrual cycles or change in bowel habits or hematochezia or melena. She is currently taking oral iron supplementation once a day.  Labs from today showed mild anemia with microcytosis and hypochromia but a normal ferritin of 126.  With regards to iron deficiency anemia, I do not believe she needs any intravenous iron at this time.  She can continue once daily iron we will also proceed with hemoglobin electrophoresis.  Thrombocytosis Initially this was thought to be related to iron deficiency anemia but given normal ferritin and associated leukocytosis, recommended myeloproliferative work-up and serum erythropoietin levels which have been ordered.  Patient will come back for additional lab draw.  Orders Placed This Encounter  Procedures   CBC with Differential/Platelet    Standing Status:   Standing    Number of Occurrences:   22    Standing Expiration Date:   04/06/2022   Iron and TIBC    Standing Status:   Future    Number of Occurrences:   1    Standing Expiration Date:   04/06/2022   Ferritin    Standing Status:   Future    Number of Occurrences:   1    Standing Expiration Date:   04/06/2022   Comprehensive metabolic panel    Standing Status:   Standing    Number of Occurrences:   33    Standing Expiration Date:   04/06/2022   Vitamin B12    Standing Status:    Future    Number of Occurrences:   1    Standing Expiration Date:   04/06/2022   Folate RBC    Standing Status:   Future    Number of Occurrences:   1    Standing Expiration Date:   04/06/2022   JAK2 (INCLUDING V617F AND EXON 12), MPL,& CALR W/RFL MPN PANEL (NGS)   Erythropoietin    Standing Status:   Future    Standing Expiration Date:   04/06/2022   Hgb Fractionation Cascade    Standing Status:   Future    Standing Expiration Date:   04/06/2022     HISTORY OF PRESENTING ILLNESS:  Ariel Taylor 60 y.o. female is here because of Anemia and elevated platelets.  This is a very pleasant 60 year old female patient with a past medical history significant for diabetes mellitus, type II, hypertension and dyslipidemia referred to hematology for evaluation of anemia and thrombocytosis.  Patient arrived to the appointment today by herself.  She denies any major complaints except for ice craving.  She denies any chest pain, shortness of breath, palpitations, unusual blood loss.  No change in bowel habits or hematochezia or melena.  She had colonoscopy in 2015 which was unremarkable and no family history of colon cancer. She had surgical menopause, no menstrual cycles currently. She says she has always been anemic and takes an oral iron supplementation once a day, recently started taking back about 1 to 2 weeks ago. Rest  of the pertinent 10 point ROS reviewed and negative.  REVIEW OF SYSTEMS:   Constitutional: Denies fevers, chills or abnormal night sweats Eyes: Denies blurriness of vision, double vision or watery eyes Ears, nose, mouth, throat, and face: Denies mucositis or sore throat Respiratory: Denies cough, dyspnea or wheezes Cardiovascular: Denies palpitation, chest discomfort or lower extremity swelling Gastrointestinal:  Denies nausea, heartburn or change in bowel habits Skin: Denies abnormal skin rashes Lymphatics: Denies new lymphadenopathy or easy bruising Neurological:Denies  numbness, tingling or new weaknesses Behavioral/Psych: Mood is stable, no new changes  All other systems were reviewed with the patient and are negative.  MEDICAL HISTORY:  Past Medical History:  Diagnosis Date   Diabetes mellitus without complication (H[facility])    Hyperlipidemia    Hypertension    Mild atherosclerosis of carotid artery, left     SURGICAL HISTORY: Past Surgical History:  Procedure Laterality Date   ABDOMINAL HYSTERECTOMY      SOCIAL HISTORY: Social History   Socioeconomic History   Marital status: Single    Spouse name: Not on file   Number of children: Not on file   Years of education: Not on file   Highest education level: Not on file  Occupational History   Not on file  Tobacco Use   Smoking status: Never   Smokeless tobacco: Never  Vaping Use   Vaping Use: Never used  Substance and Sexual Activity   Alcohol use: No    Alcohol/week: 0.0 standard drinks   Drug use: No   Sexual activity: Not on file  Other Topics Concern   Not on file  Social History Narrative   Not on file   Social Determinants of Health   Financial Resource Strain: Not on file  Food Insecurity: Not on file  Transportation Needs: Not on file  Physical Activity: Not on file  Stress: Not on file  Social Connections: Not on file  Intimate Partner Violence: Not on file    FAMILY HISTORY: Family History  Problem Relation Age of Onset   Diabetes Other    Diabetes Mother     ALLERGIES:  is allergic to acetaminophen.  MEDICATIONS:  Current Outpatient Medications  Medication Sig Dispense Refill   amLODipine (NORVASC) 5 MG tablet Take 5 mg by mouth daily.     lisinopril (PRINIVIL,ZESTRIL) 10 MG tablet Take 10 mg by mouth daily.     metFORMIN (GLUCOPHAGE) 500 MG tablet Take 1 tablet by mouth 2 (two) times daily.     rosuvastatin (CRESTOR) 5 MG tablet Take 5 mg by mouth daily.     No current facility-administered medications for this visit.     PHYSICAL  EXAMINATION:  ECOG PERFORMANCE STATUS: 0 - Asymptomatic  Vitals:   04/06/21 1102  BP: 139/81  Pulse: 91  Resp: 18  Temp: 98.6 F (37 C)  SpO2: 100%   Filed Weights   04/06/21 1102  Weight: 181 lb 4.8 oz (82.2 kg)    GENERAL:alert, no distress and comfortable SKIN: skin color, texture, turgor are normal, no rashes or significant lesions EYES: normal, conjunctiva are pink and non-injected, sclera clear OROPHARYNX:no exudate, no erythema and lips, buccal mucosa, and tongue normal  NECK: supple, thyroid normal size, non-tender, without nodularity LYMPH:  no palpable lymphadenopathy in the cervical, axillary or inguinal LUNGS: clear to auscultation and percussion with normal breathing effort HEART: regular rate & rhythm and no murmurs and no lower extremity edema ABDOMEN:abdomen soft, non-tender and normal bowel sounds Musculoskeletal:no cyanosis of digits and no  clubbing  PSYCH: alert & oriented x 3 with fluent speech NEURO: no focal motor/sensory deficits  LABORATORY DATA:  I have reviewed the data as listed Lab Results  Component Value Date   WBC 14.3 (H) 04/06/2021   HGB 11.2 (L) 04/06/2021   HCT 35.5 (L) 04/06/2021   MCV 69.2 (L) 04/06/2021   PLT 418 (H) 04/06/2021     Chemistry      Component Value Date/Time   NA 141 04/06/2021 1126   K 3.8 04/06/2021 1126   CL 105 04/06/2021 1126   CO2 28 04/06/2021 1126   BUN 12 04/06/2021 1126   CREATININE 0.91 04/06/2021 1126      Component Value Date/Time   CALCIUM 10.1 04/06/2021 1126   ALKPHOS 116 04/06/2021 1126   AST 19 04/06/2021 1126   ALT 23 04/06/2021 1126   BILITOT 0.4 04/06/2021 1126     Reviewed her labs from today which showed leukocytosis, neutrophilia and monocytosis, microcytosis and hypochromia with mild anemia and thrombocytosis. Ferritin count today is normal 126. CMP showed no evidence of hypercalcemia or high alkaline phosphatase.  Creatinine is normal.  Total protein is 8.5. Normal B12.  Folic  acid levels are in process.   RADIOGRAPHIC STUDIES: I have personally reviewed the radiological images as listed and agreed with the findings in the report. PCV ECHOCARDIOGRAM COMPLETE  Result Date: 03/13/2021 Echocardiogram 03/10/2021: Normal LV systolic function with visual EF 60-65%. Left ventricle cavity is normal in size. Mild left ventricular hypertrophy. Normal global wall motion. Unable to evaluate diastolic function. Left atrial cavity is mildly dilated. No prior study for comparison.  PCV CAROTID DUPLEX (BILATERAL)  Result Date: 03/20/2021 Carotid artery duplex 03/10/2021: Doppler velocity suggests stenosis in the right external carotid artery (<50%). Doppler velocity suggests stenosis in the left internal carotid artery (1-15%). Minimal plaque noted.  Doppler velocity suggests stenosis in the left external carotid artery (<50%). Antegrade right vertebral artery flow. Antegrade left vertebral artery flow. External carotid artery stenosis may be the source of bruit. Follow up is appropriate if clinically indicated.  PCV MYOCARDIAL PERFUSION WO LEXISCAN  Result Date: 03/27/2021 Exercise Sestamibi stress test 03/23/2021: Exercise nuclear stress test was performed using Bruce protocol. Patient reached 7.9 METS, and 89% of age predicted maximum heart rate. Exercise capacity was fair. No chest pain reported. Heart rate and hemodynamic response were normal. Stress EKG revealed equivocal ischemic changes. Normal myocardial perfusion. Stress LVEF 82%. Low risk study.   All questions were answered. The patient knows to call the clinic with any problems, questions or concerns. I spent 45 minutes in the care of this patient including H and P, review of records, counseling and coordination of care.     Benay Pike, MD 04/06/2021 3:26 PM

## 2021-04-06 NOTE — Assessment & Plan Note (Signed)
This is a very pleasant 60 year old female patient with past medical history significant for diabetes mellitus without complication, hypertension and dyslipidemia referred to hematology for evaluation of anemia and thrombocytosis.  She denies any complaints except for pica.  She has always had anemia and takes an oral iron supplementation from time to time.,  Recently started it back.  No menstrual cycles or change in bowel habits or hematochezia or melena. She is currently taking oral iron supplementation once a day.  Labs from today showed mild anemia with microcytosis and hypochromia but a normal ferritin of 126.  With regards to iron deficiency anemia, I do not believe she needs any intravenous iron at this time.  She can continue once daily iron we will also proceed with hemoglobin electrophoresis.

## 2021-04-06 NOTE — Assessment & Plan Note (Signed)
Initially this was thought to be related to iron deficiency anemia but given normal ferritin and associated leukocytosis, recommended myeloproliferative work-up and serum erythropoietin levels which have been ordered.  Patient will come back for additional lab draw.

## 2021-04-07 ENCOUNTER — Telehealth: Payer: Self-pay | Admitting: Hematology and Oncology

## 2021-04-07 ENCOUNTER — Encounter: Payer: Self-pay | Admitting: Hematology and Oncology

## 2021-04-07 ENCOUNTER — Other Ambulatory Visit: Payer: Self-pay | Admitting: Hematology and Oncology

## 2021-04-07 ENCOUNTER — Telehealth: Payer: Self-pay

## 2021-04-07 DIAGNOSIS — D72829 Elevated white blood cell count, unspecified: Secondary | ICD-10-CM

## 2021-04-07 LAB — FOLATE RBC
Folate, Hemolysate: 280 ng/mL
Folate, RBC: 757 ng/mL (ref >498)
Hematocrit: 37 % (ref 34.0–46.6)

## 2021-04-07 NOTE — Telephone Encounter (Signed)
Scheduled appointment per 07/14 sch msg. Patient is aware. 

## 2021-04-07 NOTE — Telephone Encounter (Signed)
Attempted to contact patient. LVM to return call.

## 2021-04-08 ENCOUNTER — Encounter: Payer: Self-pay | Admitting: Hematology and Oncology

## 2021-04-08 ENCOUNTER — Other Ambulatory Visit: Payer: Self-pay

## 2021-04-08 ENCOUNTER — Inpatient Hospital Stay: Payer: 59

## 2021-04-08 DIAGNOSIS — D509 Iron deficiency anemia, unspecified: Secondary | ICD-10-CM

## 2021-04-08 DIAGNOSIS — D72829 Elevated white blood cell count, unspecified: Secondary | ICD-10-CM

## 2021-04-08 LAB — CBC WITH DIFFERENTIAL/PLATELET
Abs Immature Granulocytes: 0.06 10*3/uL (ref 0.00–0.07)
Basophils Absolute: 0.1 10*3/uL (ref 0.0–0.1)
Basophils Relative: 1 %
Eosinophils Absolute: 0.3 10*3/uL (ref 0.0–0.5)
Eosinophils Relative: 4 %
HCT: 32.3 % — ABNORMAL LOW (ref 36.0–46.0)
Hemoglobin: 10.3 g/dL — ABNORMAL LOW (ref 12.0–15.0)
Immature Granulocytes: 1 %
Lymphocytes Relative: 21 %
Lymphs Abs: 1.8 10*3/uL (ref 0.7–4.0)
MCH: 21.7 pg — ABNORMAL LOW (ref 26.0–34.0)
MCHC: 31.9 g/dL (ref 30.0–36.0)
MCV: 68.1 fL — ABNORMAL LOW (ref 80.0–100.0)
Monocytes Absolute: 0.8 10*3/uL (ref 0.1–1.0)
Monocytes Relative: 10 %
Neutro Abs: 5.4 10*3/uL (ref 1.7–7.7)
Neutrophils Relative %: 63 %
Platelets: 436 10*3/uL — ABNORMAL HIGH (ref 150–400)
RBC: 4.74 MIL/uL (ref 3.87–5.11)
RDW: 15.9 % — ABNORMAL HIGH (ref 11.5–15.5)
WBC: 8.4 10*3/uL (ref 4.0–10.5)
nRBC: 0 % (ref 0.0–0.2)

## 2021-04-08 LAB — COMPREHENSIVE METABOLIC PANEL
ALT: 21 U/L (ref 0–44)
AST: 19 U/L (ref 15–41)
Albumin: 3.6 g/dL (ref 3.5–5.0)
Alkaline Phosphatase: 116 U/L (ref 38–126)
Anion gap: 7 (ref 5–15)
BUN: 11 mg/dL (ref 6–20)
CO2: 28 mmol/L (ref 22–32)
Calcium: 9.8 mg/dL (ref 8.9–10.3)
Chloride: 104 mmol/L (ref 98–111)
Creatinine, Ser: 0.86 mg/dL (ref 0.44–1.00)
GFR, Estimated: >60 mL/min (ref >60)
Glucose, Bld: 170 mg/dL — ABNORMAL HIGH (ref 70–99)
Potassium: 4 mmol/L (ref 3.5–5.1)
Sodium: 139 mmol/L (ref 135–145)
Total Bilirubin: 0.3 mg/dL (ref 0.3–1.2)
Total Protein: 7.9 g/dL (ref 6.5–8.1)

## 2021-04-09 LAB — ERYTHROPOIETIN: Erythropoietin: 16.4 m[IU]/mL (ref 2.6–18.5)

## 2021-04-12 LAB — HGB FRACTIONATION CASCADE
Hgb A2: 2.8 % (ref 1.8–3.2)
Hgb A: 97.2 % (ref 96.4–98.8)
Hgb F: 0 % (ref 0.0–2.0)
Hgb S: 0 %

## 2021-04-13 LAB — JAK2 (INCLUDING V617F AND EXON 12), MPL,& CALR W/RFL MPN PANEL (NGS)

## 2021-04-20 ENCOUNTER — Telehealth: Payer: Self-pay | Admitting: Hematology and Oncology

## 2021-04-20 NOTE — Telephone Encounter (Signed)
Scheduled appt per 7/26 sch msg. Called pt, went straight to vm. VM was full, unable to leave a msg. Will try to contact pt again later today.

## 2021-04-21 ENCOUNTER — Ambulatory Visit: Payer: 59 | Admitting: Hematology and Oncology

## 2021-04-21 ENCOUNTER — Ambulatory Visit (HOSPITAL_BASED_OUTPATIENT_CLINIC_OR_DEPARTMENT_OTHER): Payer: 59 | Admitting: Hematology and Oncology

## 2021-04-21 ENCOUNTER — Encounter: Payer: Self-pay | Admitting: Hematology and Oncology

## 2021-04-21 DIAGNOSIS — D473 Essential (hemorrhagic) thrombocythemia: Secondary | ICD-10-CM | POA: Diagnosis not present

## 2021-04-21 NOTE — Progress Notes (Deleted)
[place] NOTE  Patient Care Team: Wenda Low, MD as PCP - General (Internal Medicine)  CHIEF COMPLAINTS/PURPOSE OF CONSULTATION:  Anemia, thrombocytosis.  ASSESSMENT & PLAN:  No problem-specific Assessment & Plan notes found for this encounter.  No orders of the defined types were placed in this encounter.    HISTORY OF PRESENTING ILLNESS:  Ariel Taylor 60 y.o. female is here because of Anemia and elevated platelets.  This is a very pleasant 60 year old female patient with a past medical history significant for diabetes mellitus, type II, hypertension and dyslipidemia referred to hematology for evaluation of anemia and thrombocytosis.  Patient arrived to the appointment today by herself.  She denies any major complaints except for ice craving.  She denies any chest pain, shortness of breath, palpitations, unusual blood loss.  No change in bowel habits or hematochezia or melena.  She had colonoscopy in 2015 which was unremarkable and no family history of colon cancer. She had surgical menopause, no menstrual cycles currently. She says she has always been anemic and takes an oral iron supplementation once a day, recently started taking back about 1 to 2 weeks ago. Rest of the pertinent 10 point ROS reviewed and negative.  REVIEW OF SYSTEMS:   Constitutional: Denies fevers, chills or abnormal night sweats Eyes: Denies blurriness of vision, double vision or watery eyes Ears, nose, mouth, throat, and face: Denies mucositis or sore throat Respiratory: Denies cough, dyspnea or wheezes Cardiovascular: Denies palpitation, chest discomfort or lower extremity swelling Gastrointestinal:  Denies nausea, heartburn or change in bowel habits Skin: Denies abnormal skin rashes Lymphatics: Denies new lymphadenopathy or easy bruising Neurological:Denies numbness, tingling or new weaknesses Behavioral/Psych: Mood is stable, no new changes  All other systems were  reviewed with the patient and are negative.  MEDICAL HISTORY:  Past Medical History:  Diagnosis Date   Diabetes mellitus without complication (HCC)    Hyperlipidemia    Hypertension    Mild atherosclerosis of carotid artery, left     SURGICAL HISTORY: Past Surgical History:  Procedure Laterality Date   ABDOMINAL HYSTERECTOMY      SOCIAL HISTORY: Social History   Socioeconomic History   Marital status: Single    Spouse name: Not on file   Number of children: Not on file   Years of education: Not on file   Highest education level: Not on file  Occupational History   Not on file  Tobacco Use   Smoking status: Never   Smokeless tobacco: Never  Vaping Use   Vaping Use: Never used  Substance and Sexual Activity   Alcohol use: No    Alcohol/week: 0.0 standard drinks   Drug use: No   Sexual activity: Not on file  Other Topics Concern   Not on file  Social History Narrative   Not on file   Social Determinants of Health   Financial Resource Strain: Not on file  Food Insecurity: Not on file  Transportation Needs: Not on file  Physical Activity: Not on file  Stress: Not on file  Social Connections: Not on file  Intimate Partner Violence: Not on file    FAMILY HISTORY: Family History  Problem Relation Age of Onset   Diabetes Other    Diabetes Mother     ALLERGIES:  is allergic to acetaminophen.  MEDICATIONS:  Current Outpatient Medications  Medication Sig Dispense Refill   amLODipine (NORVASC) 5 MG tablet Take 5 mg by mouth daily.     lisinopril (PRINIVIL,ZESTRIL) 10 MG tablet  Take 10 mg by mouth daily.     metFORMIN (GLUCOPHAGE) 500 MG tablet Take 1 tablet by mouth 2 (two) times daily.     rosuvastatin (CRESTOR) 5 MG tablet Take 5 mg by mouth daily.     No current facility-administered medications for this visit.     PHYSICAL EXAMINATION:  ECOG PERFORMANCE STATUS: 0 - Asymptomatic  There were no vitals filed for this visit.  There were no vitals  filed for this visit.   GENERAL:alert, no distress and comfortable SKIN: skin color, texture, turgor are normal, no rashes or significant lesions EYES: normal, conjunctiva are pink and non-injected, sclera clear OROPHARYNX:no exudate, no erythema and lips, buccal mucosa, and tongue normal  NECK: supple, thyroid normal size, non-tender, without nodularity LYMPH:  no palpable lymphadenopathy in the cervical, axillary or inguinal LUNGS: clear to auscultation and percussion with normal breathing effort HEART: regular rate & rhythm and no murmurs and no lower extremity edema ABDOMEN:abdomen soft, non-tender and normal bowel sounds Musculoskeletal:no cyanosis of digits and no clubbing  PSYCH: alert & oriented x 3 with fluent speech NEURO: no focal motor/sensory deficits  LABORATORY DATA:  I have reviewed the data as listed Lab Results  Component Value Date   WBC 8.4 04/08/2021   HGB 10.3 (L) 04/08/2021   HCT 32.3 (L) 04/08/2021   MCV 68.1 (L) 04/08/2021   PLT 436 (H) 04/08/2021     Chemistry      Component Value Date/Time   NA 139 04/08/2021 0920   K 4.0 04/08/2021 0920   CL 104 04/08/2021 0920   CO2 28 04/08/2021 0920   BUN 11 04/08/2021 0920   CREATININE 0.86 04/08/2021 0920      Component Value Date/Time   CALCIUM 9.8 04/08/2021 0920   ALKPHOS 116 04/08/2021 0920   AST 19 04/08/2021 0920   ALT 21 04/08/2021 0920   BILITOT 0.3 04/08/2021 0920     Reviewed her labs from today which showed leukocytosis, neutrophilia and monocytosis, microcytosis and hypochromia with mild anemia and thrombocytosis. Ferritin count today is normal 126. CMP showed no evidence of hypercalcemia or high alkaline phosphatase.  Creatinine is normal.  Total protein is 8.5. Normal B12.  Folic acid levels are in process.   RADIOGRAPHIC STUDIES: I have personally reviewed the radiological images as listed and agreed with the findings in the report. PCV MYOCARDIAL PERFUSION WO LEXISCAN  Result Date:  03/27/2021 Exercise Sestamibi stress test 03/23/2021: Exercise nuclear stress test was performed using Bruce protocol. Patient reached 7.9 METS, and 89% of age predicted maximum heart rate. Exercise capacity was fair. No chest pain reported. Heart rate and hemodynamic response were normal. Stress EKG revealed equivocal ischemic changes. Normal myocardial perfusion. Stress LVEF 82%. Low risk study.   All questions were answered. The patient knows to call the clinic with any problems, questions or concerns. I spent 45 minutes in the care of this patient including H and P, review of records, counseling and coordination of care.     Benay Pike, MD 04/21/2021 9:12 AM

## 2021-04-21 NOTE — Progress Notes (Signed)
[place] NOTE  Patient Care Team: Wenda Low, MD as PCP - General (Internal Medicine)  CHIEF COMPLAINTS/PURPOSE OF CONSULTATION:  Anemia, thrombocytosis.  ASSESSMENT & PLAN:   This is a very pleasant 60 year old female patient referred to [place] because of anemia and elevated platelets who is here for follow-up.  During her last visit, we have discussed about further evaluation of thrombocytosis and anemia.  She is here for telephone visit to review the labs and to discuss any additional recommendations.  She feels really well and is moving to [place] in about 10 days but plans to keep her appointments in [place] with her doctors. 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 She is considered low risk essential thrombocytosis given lack of history of thrombosis and age under 102.  I have recommended that she start on baby aspirin daily.  We have discussed about increased risk of blood clots, cardiovascular events with this particular mutation but I do not believe she needs any hydroxyurea or anticoagulation at this time. We have discussed that this is premalignant and needs to be monitored on a regular basis.  She would like to come for follow-up around Thanksgiving time.  I will schedule her for a follow-up with one of our PAs since I will be unavailable.  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  Hemoglobin electrophoresis did not suggest presence of beta thalassemia minor, she might benefit from testing for alpha thalassemia when she returns for follow-up. Thank you for consulting [place] in the care of this patient.  Please do not hesitate to contact us with any additional questions or concerns.  HISTORY OF PRESENTING ILLNESS:  Ariel Taylor 60 y.o. female is here because of anemia and elevated  platelets.  This is a very pleasant 60 year old female patient with a past medical history significant for diabetes mellitus, type II, hypertension and dyslipidemia referred to hematology for evaluation of anemia and thrombocytosis.   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 She is here for a phone visit.  She is doing absolutely well, moving to [place] in about 10 days and is planning to return to follow-up in November.  No complaints since her last visit.  Rest of the pertinent 10 point ROS reviewed and negative.  REVIEW OF SYSTEMS:   Constitutional: Denies fevers, chills or abnormal night sweats Eyes: Denies blurriness of vision, double vision or watery eyes Ears, nose, mouth, throat, and face: Denies mucositis or sore throat Respiratory: Denies cough, dyspnea or wheezes Cardiovascular: Denies palpitation, chest discomfort or lower extremity swelling Gastrointestinal:  Denies nausea, heartburn or change in bowel habits Skin: Denies abnormal skin rashes Lymphatics: Denies new lymphadenopathy or easy bruising Neurological:Denies numbness, tingling or new weaknesses Behavioral/Psych: Mood is stable, no new changes  All other systems were reviewed with the patient and are negative.  MEDICAL HISTORY:  Past Medical History:  Diagnosis Date   Diabetes mellitus without complication ([place])    Hyperlipidemia    Hypertension    Mild atherosclerosis of carotid artery, left     SURGICAL HISTORY: Past Surgical History:  Procedure Laterality Date   ABDOMINAL HYSTERECTOMY      SOCIAL HISTORY: Social History   Socioeconomic History   Marital status: Single    Spouse name: Not on file  Number of children: Not on file   Years of education: Not on file   Highest education level: Not on file  Occupational History   Not on file  Tobacco Use   Smoking status: Never   Smokeless tobacco: Never  Vaping Use   Vaping Use: Never used   Substance and Sexual Activity   Alcohol use: No    Alcohol/week: 0.0 standard drinks   Drug use: No   Sexual activity: Not on file  Other Topics Concern   Not on file  Social History Narrative   Not on file   Social Determinants of Health   Financial Resource Strain: Not on file  Food Insecurity: Not on file  Transportation Needs: Not on file  Physical Activity: Not on file  Stress: Not on file  Social Connections: Not on file  Intimate Partner Violence: Not on file    FAMILY HISTORY: Family History  Problem Relation Age of Onset   Diabetes Other    Diabetes Mother     ALLERGIES:  is allergic to acetaminophen.  MEDICATIONS:  Current Outpatient Medications  Medication Sig Dispense Refill   amLODipine (NORVASC) 5 MG tablet Take 5 mg by mouth daily.     lisinopril (PRINIVIL,ZESTRIL) 10 MG tablet Take 10 mg by mouth daily.     metFORMIN (GLUCOPHAGE) 500 MG tablet Take 1 tablet by mouth 2 (two) times daily.     rosuvastatin (CRESTOR) 5 MG tablet Take 5 mg by mouth daily.     No current facility-administered medications for this visit.     PHYSICAL EXAMINATION:  ECOG PERFORMANCE STATUS: 0 - Asymptomatic  Vital signs and physical examination not done, telephone visit  LABORATORY DATA:  I have reviewed the data as listed Lab Results  Component Value Date   WBC 8.4 04/08/2021   HGB 10.3 (L) 04/08/2021   HCT 32.3 (L) 04/08/2021   MCV 68.1 (L) 04/08/2021   PLT 436 (H) 04/08/2021     Chemistry      Component Value Date/Time   NA 139 04/08/2021 0920   K 4.0 04/08/2021 0920   CL 104 04/08/2021 0920   CO2 28 04/08/2021 0920   BUN 11 04/08/2021 0920   CREATININE 0.86 04/08/2021 0920      Component Value Date/Time   CALCIUM 9.8 04/08/2021 0920   ALKPHOS 116 04/08/2021 0920   AST 19 04/08/2021 0920   ALT 21 04/08/2021 0920   BILITOT 0.3 04/08/2021 0920     Reviewed her labs showed leukocytosis, neutrophilia and monocytosis, microcytosis and hypochromia with  mild anemia and thrombocytosis. Ferritin count today is normal 126. CMP showed no evidence of hypercalcemia or high alkaline phosphatase.  Creatinine is normal.  Total protein is 8.5. Normal B12.  Folic acid levels normal Given her immature granulocytes and thrombocytosis, I have done myeloproliferative panel which showed presence of JAK2 V6 17E mutation.  RADIOGRAPHIC STUDIES: I have personally reviewed the radiological images as listed and agreed with the findings in the report. PCV MYOCARDIAL PERFUSION WO LEXISCAN  Result Date: 03/27/2021 Exercise Sestamibi stress test 03/23/2021: Exercise nuclear stress test was performed using Bruce protocol. Patient reached 7.9 METS, and 89% of age predicted maximum heart rate. Exercise capacity was fair. No chest pain reported. Heart rate and hemodynamic response were normal. Stress EKG revealed equivocal ischemic changes. Normal myocardial perfusion. Stress LVEF 82%. Low risk study.   All questions were answered. The patient knows to call the clinic with any problems, questions or concerns. I spent 15  minutes in the care of this patient including H, review of records, counseling and coordination of care.  I connected with  Ariel Taylor on 04/21/21 by a telephone application and verified that I am speaking with the correct person using two identifiers.   I discussed the limitations of evaluation and management by telemedicine. The patient expressed understanding and agreed to proceed.     Benay Pike, MD 04/21/2021 2:29 PM

## 2021-04-22 ENCOUNTER — Telehealth: Payer: Self-pay | Admitting: Hematology and Oncology

## 2021-04-22 NOTE — Telephone Encounter (Signed)
Scheduled appt per 7/28 sch msg. Pt aware.  

## 2021-08-17 ENCOUNTER — Other Ambulatory Visit: Payer: Self-pay | Admitting: Hematology and Oncology

## 2021-08-17 ENCOUNTER — Inpatient Hospital Stay: Payer: Managed Care, Other (non HMO) | Attending: Hematology and Oncology

## 2021-08-17 ENCOUNTER — Other Ambulatory Visit: Payer: Self-pay

## 2021-08-17 ENCOUNTER — Inpatient Hospital Stay: Payer: Managed Care, Other (non HMO) | Admitting: Hematology and Oncology

## 2021-08-17 ENCOUNTER — Other Ambulatory Visit: Payer: 59

## 2021-08-17 ENCOUNTER — Ambulatory Visit: Payer: 59 | Admitting: Physician Assistant

## 2021-08-17 ENCOUNTER — Ambulatory Visit: Payer: 59 | Admitting: Hematology and Oncology

## 2021-08-17 VITALS — BP 144/78 | HR 74 | Temp 97.6°F | Resp 16 | Ht 67.0 in | Wt 187.4 lb

## 2021-08-17 DIAGNOSIS — D72829 Elevated white blood cell count, unspecified: Secondary | ICD-10-CM

## 2021-08-17 DIAGNOSIS — I10 Essential (primary) hypertension: Secondary | ICD-10-CM | POA: Diagnosis not present

## 2021-08-17 DIAGNOSIS — D509 Iron deficiency anemia, unspecified: Secondary | ICD-10-CM | POA: Insufficient documentation

## 2021-08-17 DIAGNOSIS — D473 Essential (hemorrhagic) thrombocythemia: Secondary | ICD-10-CM | POA: Insufficient documentation

## 2021-08-17 DIAGNOSIS — E119 Type 2 diabetes mellitus without complications: Secondary | ICD-10-CM | POA: Insufficient documentation

## 2021-08-17 DIAGNOSIS — Z90710 Acquired absence of both cervix and uterus: Secondary | ICD-10-CM | POA: Insufficient documentation

## 2021-08-17 DIAGNOSIS — Z79899 Other long term (current) drug therapy: Secondary | ICD-10-CM | POA: Insufficient documentation

## 2021-08-17 LAB — CBC WITH DIFFERENTIAL (CANCER CENTER ONLY)
Abs Immature Granulocytes: 0.02 10*3/uL (ref 0.00–0.07)
Basophils Absolute: 0 10*3/uL (ref 0.0–0.1)
Basophils Relative: 0 %
Eosinophils Absolute: 0.2 10*3/uL (ref 0.0–0.5)
Eosinophils Relative: 3 %
HCT: 34.1 % — ABNORMAL LOW (ref 36.0–46.0)
Hemoglobin: 10.6 g/dL — ABNORMAL LOW (ref 12.0–15.0)
Immature Granulocytes: 0 %
Lymphocytes Relative: 26 %
Lymphs Abs: 1.7 10*3/uL (ref 0.7–4.0)
MCH: 21.7 pg — ABNORMAL LOW (ref 26.0–34.0)
MCHC: 31.1 g/dL (ref 30.0–36.0)
MCV: 69.9 fL — ABNORMAL LOW (ref 80.0–100.0)
Monocytes Absolute: 0.5 10*3/uL (ref 0.1–1.0)
Monocytes Relative: 7 %
Neutro Abs: 4.3 10*3/uL (ref 1.7–7.7)
Neutrophils Relative %: 64 %
Platelet Count: 447 10*3/uL — ABNORMAL HIGH (ref 150–400)
RBC: 4.88 MIL/uL (ref 3.87–5.11)
RDW: 15.9 % — ABNORMAL HIGH (ref 11.5–15.5)
WBC Count: 6.7 10*3/uL (ref 4.0–10.5)
nRBC: 0 % (ref 0.0–0.2)

## 2021-08-17 LAB — RETIC PANEL
Immature Retic Fract: 16.7 % — ABNORMAL HIGH (ref 2.3–15.9)
RBC.: 4.8 MIL/uL (ref 3.87–5.11)
Retic Count, Absolute: 58.1 10*3/uL (ref 19.0–186.0)
Retic Ct Pct: 1.2 % (ref 0.4–3.1)
Reticulocyte Hemoglobin: 25.3 pg — ABNORMAL LOW (ref >27.9)

## 2021-08-17 LAB — CMP (CANCER CENTER ONLY)
ALT: 21 U/L (ref 0–44)
AST: 19 U/L (ref 15–41)
Albumin: 4 g/dL (ref 3.5–5.0)
Alkaline Phosphatase: 107 U/L (ref 38–126)
Anion gap: 7 (ref 5–15)
BUN: 11 mg/dL (ref 6–20)
CO2: 26 mmol/L (ref 22–32)
Calcium: 9.5 mg/dL (ref 8.9–10.3)
Chloride: 109 mmol/L (ref 98–111)
Creatinine: 0.88 mg/dL (ref 0.44–1.00)
GFR, Estimated: >60 mL/min (ref >60)
Glucose, Bld: 135 mg/dL — ABNORMAL HIGH (ref 70–99)
Potassium: 3.7 mmol/L (ref 3.5–5.1)
Sodium: 142 mmol/L (ref 135–145)
Total Bilirubin: 0.3 mg/dL (ref 0.3–1.2)
Total Protein: 8 g/dL (ref 6.5–8.1)

## 2021-08-17 LAB — FERRITIN: Ferritin: 84 ng/mL (ref 11–307)

## 2021-08-17 LAB — IRON AND TIBC
Iron: 84 ug/dL (ref 41–142)
Saturation Ratios: 23 % (ref 21–57)
TIBC: 369 ug/dL (ref 236–444)
UIBC: 285 ug/dL (ref 120–384)

## 2021-08-17 NOTE — Progress Notes (Unsigned)
[place] [phone]   [phone]  PROGRESS NOTE  Patient Care Team: Wenda Low, MD as PCP - General (Internal Medicine)  Hematological/Oncological History # Essential Thrombocytosis, JAK2 positive 04/08/2021: JAK2 V617F positive on MPN panel. WBC 14.3, Hgb 11.2, MCV 69.2, Plt 418. Iron sat 5%.   Interval History:  Ariel Taylor 60 y.o. female with medical history significant for JAK2 positive ET who presents for a follow up visit. The patient's last visit was on 04/21/2021. In the interim since the last visit ***  On exam today Ariel Taylor ***  MEDICAL HISTORY:  Past Medical History:  Diagnosis Date   Diabetes mellitus without complication (HCC)    Hyperlipidemia    Hypertension    Mild atherosclerosis of carotid artery, left     SURGICAL HISTORY: Past Surgical History:  Procedure Laterality Date   ABDOMINAL HYSTERECTOMY      SOCIAL HISTORY: Social History   Socioeconomic History   Marital status: Single    Spouse name: Not on file   Number of children: Not on file   Years of education: Not on file   Highest education level: Not on file  Occupational History   Not on file  Tobacco Use   Smoking status: Never   Smokeless tobacco: Never  Vaping Use   Vaping Use: Never used  Substance and Sexual Activity   Alcohol use: No    Alcohol/week: 0.0 standard drinks   Drug use: No   Sexual activity: Not on file  Other Topics Concern   Not on file  Social History Narrative   Not on file   Social Determinants of Health   Financial Resource Strain: Not on file  Food Insecurity: Not on file  Transportation Needs: Not on file  Physical Activity: Not on file  Stress: Not on file  Social Connections: Not on file  Intimate Partner Violence: Not on file    FAMILY HISTORY: Family History  Problem Relation Age of Onset   Diabetes Other    Diabetes Mother     ALLERGIES:  is allergic to acetaminophen.  MEDICATIONS:   Current Outpatient Medications  Medication Sig Dispense Refill   amLODipine (NORVASC) 5 MG tablet Take 5 mg by mouth daily.     lisinopril (PRINIVIL,ZESTRIL) 10 MG tablet Take 10 mg by mouth daily.     metFORMIN (GLUCOPHAGE) 500 MG tablet Take 1 tablet by mouth 2 (two) times daily.     rosuvastatin (CRESTOR) 5 MG tablet Take 5 mg by mouth daily.     No current facility-administered medications for this visit.    REVIEW OF SYSTEMS:   Constitutional: ( - ) fevers, ( - )  chills , ( - ) night sweats Eyes: ( - ) blurriness of vision, ( - ) double vision, ( - ) watery eyes Ears, nose, mouth, throat, and face: ( - ) mucositis, ( - ) sore throat Respiratory: ( - ) cough, ( - ) dyspnea, ( - ) wheezes Cardiovascular: ( - ) palpitation, ( - ) chest discomfort, ( - ) lower extremity swelling Gastrointestinal:  ( - ) nausea, ( - ) heartburn, ( - ) change in bowel habits Skin: ( - ) abnormal skin rashes Lymphatics: ( - ) new lymphadenopathy, ( - ) easy bruising Neurological: ( - ) numbness, ( - ) tingling, ( - ) new weaknesses Behavioral/Psych: ( - ) mood change, ( - ) new changes  All other systems were reviewed with the patient and are negative.  PHYSICAL EXAMINATION: ECOG PERFORMANCE STATUS: {CHL ONC ECOG PS:[id number]}  There were no vitals filed for this visit. There were no vitals filed for this visit.  GENERAL: alert, no distress and comfortable SKIN: skin color, texture, turgor are normal, no rashes or significant lesions EYES: conjunctiva are pink and non-injected, sclera clear OROPHARYNX: no exudate, no erythema; lips, buccal mucosa, and tongue normal  NECK: supple, non-tender LYMPH:  no palpable lymphadenopathy in the cervical, axillary or inguinal LUNGS: clear to auscultation and percussion with normal breathing effort HEART: regular rate & rhythm and no murmurs and no lower extremity edema ABDOMEN: soft, non-tender, non-distended, normal bowel sounds Musculoskeletal: no  cyanosis of digits and no clubbing  PSYCH: alert & oriented x 3, fluent speech NEURO: no focal motor/sensory deficits  LABORATORY DATA:  I have reviewed the data as listed CBC Latest Ref Rng & Units 04/08/2021 04/06/2021 04/06/2021  WBC 4.0 - 10.5 K/uL 8.4 14.3(H) -  Hemoglobin 12.0 - 15.0 g/dL 10.3(L) 11.2(L) -  Hematocrit 36.0 - 46.0 % 32.3(L) 35.5(L) 37.0  Platelets 150 - 400 K/uL 436(H) 418(H) -    CMP Latest Ref Rng & Units 04/08/2021 04/06/2021  Glucose 70 - 99 mg/dL 170(H) 124(H)  BUN 6 - 20 mg/dL 11 12  Creatinine 0.44 - 1.00 mg/dL 0.86 0.91  Sodium 135 - 145 mmol/L 139 141  Potassium 3.5 - 5.1 mmol/L 4.0 3.8  Chloride 98 - 111 mmol/L 104 105  CO2 22 - 32 mmol/L 28 28  Calcium 8.9 - 10.3 mg/dL 9.8 10.1  Total Protein 6.5 - 8.1 g/dL 7.9 8.5(H)  Total Bilirubin 0.3 - 1.2 mg/dL 0.3 0.4  Alkaline Phos 38 - 126 U/L 116 116  AST 15 - 41 U/L 19 19  ALT 0 - 44 U/L 21 23    No results found for: MPROTEIN No results found for: KPAFRELGTCHN, LAMBDASER, KAPLAMBRATIO   BLOOD FILM: *** Review of the peripheral blood smear showed normal appearing white cells with neutrophils that were appropriately lobated and granulated. There was no predominance of bi-lobed or hyper-segmented neutrophils appreciated. No Dohle bodies were noted. There was no left shifting, immature forms or blasts noted. Lymphocytes remain normal in size without any predominance of large granular lymphocytes. Red cells show no anisopoikilocytosis, macrocytes , microcytes or polychromasia. There were no schistocytes, target cells, echinocytes, acanthocytes, dacrocytes, or stomatocytes.There was no rouleaux formation, nucleated red cells, or intra-cellular inclusions noted. The platelets are normal in size, shape, and color without any clumping evident.  RADIOGRAPHIC STUDIES: I have personally reviewed the radiological images as listed and agreed with the findings in the report. No results found.  ASSESSMENT &  PLAN ***  Orders Placed This Encounter  Procedures   CBC with Differential ([place] Only)    Standing Status:   Future    Standing Expiration Date:   08/17/2022   CMP ([place] only)    Standing Status:   Future    Standing Expiration Date:   08/17/2022    All questions were answered. The patient knows to call the clinic with any problems, questions or concerns.  A total of more than {CHL ONC TIME VISIT - XFGHW:[id number]} were spent on this encounter with face-to-face time and non-face-to-face time, including preparing to see the patient, ordering tests and/or medications, counseling the patient and coordination of care as outlined above.   Ledell Peoples, MD Department of Hematology/Oncology [place] [facility] Phone: [phone] Pager: [phone] Email: [email or web address]  08/17/2021 12:56  PM

## 2021-08-25 ENCOUNTER — Telehealth: Payer: Self-pay | Admitting: Hematology and Oncology

## 2021-08-25 MED ORDER — FERROUS SULFATE 325 (65 FE) MG PO TABS: 325.0000 mg | ORAL_TABLET | Freq: Every day | ORAL | 3 refills | Status: AC

## 2021-08-25 NOTE — Telephone Encounter (Signed)
Sch per 11/23 los, left msg

## 2021-08-25 NOTE — Progress Notes (Signed)
[place] [phone]   [phone]  PROGRESS NOTE  Patient Care Team: Wenda Low, MD as PCP - General (Internal Medicine)  Hematological/Oncological History # Essential Thrombocytosis, JAK2 positive 04/08/2021: JAK2 V617F positive on MPN panel. WBC 14.3, Hgb 11.2, MCV 69.2, Plt 418. Iron sat 5%.    Interval History:  Ariel Taylor 60 y.o. female with medical history significant for JAK2 positive ET who presents for a follow up visit. The patient's last visit was on 04/21/2021. In the interim since the last visit has not had any major changes in her health.  On exam today Ariel Taylor that she has been taking her 81 mg aspirin daily without any difficulty.  She denies any bleeding, bruising, or dark stools.  She has that her energy levels are good.  She denies having any issues with ice cravings and Taylor no menstrual bleeding as she underwent a hysterectomy in 2009.  She also went through a colonoscopy at age 18 and Taylor that a 10-year follow-up colonoscopy was recommended.  She denies having any issues with dark stools.  She currently denies any fevers, chills, sweats, nausea, vomiting or diarrhea.  A full 10 point ROS is listed below.  MEDICAL HISTORY:  Past Medical History:  Diagnosis Date   Diabetes mellitus without complication (HCC)    Hyperlipidemia    Hypertension    Mild atherosclerosis of carotid artery, left     SURGICAL HISTORY: Past Surgical History:  Procedure Laterality Date   ABDOMINAL HYSTERECTOMY      SOCIAL HISTORY: Social History   Socioeconomic History   Marital status: Single    Spouse name: Not on file   Number of children: Not on file   Years of education: Not on file   Highest education level: Not on file  Occupational History   Not on file  Tobacco Use   Smoking status: Never   Smokeless tobacco: Never  Vaping Use   Vaping Use: Never used  Substance and Sexual Activity   Alcohol use:  No    Alcohol/week: 0.0 standard drinks   Drug use: No   Sexual activity: Not on file  Other Topics Concern   Not on file  Social History Narrative   Not on file   Social Determinants of Health   Financial Resource Strain: Not on file  Food Insecurity: Not on file  Transportation Needs: Not on file  Physical Activity: Not on file  Stress: Not on file  Social Connections: Not on file  Intimate Partner Violence: Not on file    FAMILY HISTORY: Family History  Problem Relation Age of Onset   Diabetes Other    Diabetes Mother     ALLERGIES:  is allergic to acetaminophen.  MEDICATIONS:  Current Outpatient Medications  Medication Sig Dispense Refill   ferrous sulfate 325 (65 FE) MG tablet Take 1 tablet (325 mg total) by mouth daily with breakfast. Please take with a source of Vitamin C 90 tablet 3   amLODipine (NORVASC) 5 MG tablet Take 5 mg by mouth daily.     lisinopril (PRINIVIL,ZESTRIL) 10 MG tablet Take 10 mg by mouth daily.     metFORMIN (GLUCOPHAGE) 500 MG tablet Take 1 tablet by mouth 2 (two) times daily.     rosuvastatin (CRESTOR) 5 MG tablet Take 5 mg by mouth daily.     No current facility-administered medications for this visit.    REVIEW OF SYSTEMS:   Constitutional: ( - ) fevers, ( - )  chills , ( - ) night sweats Eyes: ( - ) blurriness of vision, ( - ) double vision, ( - ) watery eyes Ears, nose, mouth, throat, and face: ( - ) mucositis, ( - ) sore throat Respiratory: ( - ) cough, ( - ) dyspnea, ( - ) wheezes Cardiovascular: ( - ) palpitation, ( - ) chest discomfort, ( - ) lower extremity swelling Gastrointestinal:  ( - ) nausea, ( - ) heartburn, ( - ) change in bowel habits Skin: ( - ) abnormal skin rashes Lymphatics: ( - ) new lymphadenopathy, ( - ) easy bruising Neurological: ( - ) numbness, ( - ) tingling, ( - ) new weaknesses Behavioral/Psych: ( - ) mood change, ( - ) new changes  All other systems were reviewed with the patient and are  negative.  PHYSICAL EXAMINATION:  Vitals:   08/17/21 1411  BP: (!) 144/78  Pulse: 74  Resp: 16  Temp: 97.6 F (36.4 C)  SpO2: 100%   Filed Weights   08/17/21 1411  Weight: 187 lb 6.4 oz (85 kg)    GENERAL: Well-appearing middle-aged African-American female, alert, no distress and comfortable SKIN: skin color, texture, turgor are normal, no rashes or significant lesions EYES: conjunctiva are pink and non-injected, sclera clear LUNGS: clear to auscultation and percussion with normal breathing effort HEART: regular rate & rhythm and no murmurs and no lower extremity edema Musculoskeletal: no cyanosis of digits and no clubbing  PSYCH: alert & oriented x 3, fluent speech NEURO: no focal motor/sensory deficits  LABORATORY DATA:  I have reviewed the data as listed CBC Latest Ref Rng & Units 08/17/2021 04/08/2021 04/06/2021  WBC 4.0 - 10.5 K/uL 6.7 8.4 14.3(H)  Hemoglobin 12.0 - 15.0 g/dL 10.6(L) 10.3(L) 11.2(L)  Hematocrit 36.0 - 46.0 % 34.1(L) 32.3(L) 35.5(L)  Platelets 150 - 400 K/uL 447(H) 436(H) 418(H)    CMP Latest Ref Rng & Units 08/17/2021 04/08/2021 04/06/2021  Glucose 70 - 99 mg/dL 135(H) 170(H) 124(H)  BUN 6 - 20 mg/dL 11 11 12   Creatinine 0.44 - 1.00 mg/dL 0.88 0.86 0.91  Sodium 135 - 145 mmol/L 142 139 141  Potassium 3.5 - 5.1 mmol/L 3.7 4.0 3.8  Chloride 98 - 111 mmol/L 109 104 105  CO2 22 - 32 mmol/L 26 28 28   Calcium 8.9 - 10.3 mg/dL 9.5 9.8 10.1  Total Protein 6.5 - 8.1 g/dL 8.0 7.9 8.5(H)  Total Bilirubin 0.3 - 1.2 mg/dL 0.3 0.3 0.4  Alkaline Phos 38 - 126 U/L 107 116 116  AST 15 - 41 U/L 19 19 19   ALT 0 - 44 U/L 21 21 23     RADIOGRAPHIC STUDIES: No results found.  ASSESSMENT & PLAN Ariel Taylor 60 y.o. female with medical history significant for JAK2 positive ET who presents for a follow up visit.   # Essential Thrombocytosis, JAK2 positive -- Patient is considered low risk, agree with treatment with aspirin therapy alone at this time. --Patient  denies any bleeding, bruising, or dark stools. --Return to clinic in 3 months time for continued evaluation.  # Iron Deficiency Anemia -- Patient's findings are concerning for a microcytic anemia.  Will verify iron levels with ferritin, iron panel, and reticulocyte panel --Prior hemoglobin fractionation cascade was negative for any hemoglobin abnormalities --If patient is found to have normal iron levels with no clear etiology for her microcytosis would recommend pursuing alpha globulin gene testing. --Return to clinic in 3 months time to see how she is doing on p.o. iron  therapy.  No orders of the defined types were placed in this encounter.   All questions were answered. The patient knows to call the clinic with any problems, questions or concerns.  A total of more than 30 minutes were spent on this encounter with face-to-face time and non-face-to-face time, including preparing to see the patient, ordering tests and/or medications, counseling the patient and coordination of care as outlined above.   Ledell Peoples, MD Department of Hematology/Oncology [place] [facility] Phone: [phone] Pager: [phone] Email: [email or web address]  08/25/2021 7:20 PM

## 2021-11-16 ENCOUNTER — Ambulatory Visit: Payer: Managed Care, Other (non HMO) | Admitting: Hematology and Oncology

## 2021-11-16 ENCOUNTER — Other Ambulatory Visit: Payer: Managed Care, Other (non HMO)

## 2021-12-21 ENCOUNTER — Ambulatory Visit: Payer: Managed Care, Other (non HMO) | Admitting: Hematology and Oncology

## 2021-12-21 ENCOUNTER — Other Ambulatory Visit: Payer: Managed Care, Other (non HMO)

## 2021-12-23 ENCOUNTER — Inpatient Hospital Stay: Payer: Managed Care, Other (non HMO) | Admitting: Hematology and Oncology

## 2021-12-23 ENCOUNTER — Other Ambulatory Visit: Payer: Self-pay

## 2021-12-23 ENCOUNTER — Other Ambulatory Visit: Payer: Self-pay | Admitting: Hematology and Oncology

## 2021-12-23 ENCOUNTER — Inpatient Hospital Stay: Payer: Managed Care, Other (non HMO) | Attending: Hematology and Oncology

## 2021-12-23 ENCOUNTER — Encounter: Payer: Self-pay | Admitting: Hematology and Oncology

## 2021-12-23 VITALS — BP 134/83 | HR 66 | Temp 97.7°F | Resp 18 | Ht 67.0 in | Wt 189.7 lb

## 2021-12-23 DIAGNOSIS — Z90710 Acquired absence of both cervix and uterus: Secondary | ICD-10-CM | POA: Insufficient documentation

## 2021-12-23 DIAGNOSIS — D509 Iron deficiency anemia, unspecified: Secondary | ICD-10-CM | POA: Diagnosis not present

## 2021-12-23 DIAGNOSIS — D473 Essential (hemorrhagic) thrombocythemia: Secondary | ICD-10-CM | POA: Diagnosis not present

## 2021-12-23 DIAGNOSIS — D72829 Elevated white blood cell count, unspecified: Secondary | ICD-10-CM

## 2021-12-23 DIAGNOSIS — D75839 Thrombocytosis, unspecified: Secondary | ICD-10-CM | POA: Insufficient documentation

## 2021-12-23 DIAGNOSIS — I10 Essential (primary) hypertension: Secondary | ICD-10-CM | POA: Diagnosis not present

## 2021-12-23 LAB — CBC WITH DIFFERENTIAL/PLATELET
Abs Immature Granulocytes: 0.03 10*3/uL (ref 0.00–0.07)
Basophils Absolute: 0 10*3/uL (ref 0.0–0.1)
Basophils Relative: 1 %
Eosinophils Absolute: 0.3 10*3/uL (ref 0.0–0.5)
Eosinophils Relative: 4 %
HCT: 34.7 % — ABNORMAL LOW (ref 36.0–46.0)
Hemoglobin: 10.7 g/dL — ABNORMAL LOW (ref 12.0–15.0)
Immature Granulocytes: 0 %
Lymphocytes Relative: 21 %
Lymphs Abs: 1.6 10*3/uL (ref 0.7–4.0)
MCH: 21.4 pg — ABNORMAL LOW (ref 26.0–34.0)
MCHC: 30.8 g/dL (ref 30.0–36.0)
MCV: 69.3 fL — ABNORMAL LOW (ref 80.0–100.0)
Monocytes Absolute: 0.5 10*3/uL (ref 0.1–1.0)
Monocytes Relative: 7 %
Neutro Abs: 4.9 10*3/uL (ref 1.7–7.7)
Neutrophils Relative %: 67 %
Platelets: 459 10*3/uL — ABNORMAL HIGH (ref 150–400)
RBC: 5.01 MIL/uL (ref 3.87–5.11)
RDW: 16 % — ABNORMAL HIGH (ref 11.5–15.5)
WBC: 7.3 10*3/uL (ref 4.0–10.5)
nRBC: 0 % (ref 0.0–0.2)

## 2021-12-23 LAB — COMPREHENSIVE METABOLIC PANEL
ALT: 17 U/L (ref 0–44)
AST: 15 U/L (ref 15–41)
Albumin: 4.3 g/dL (ref 3.5–5.0)
Alkaline Phosphatase: 101 U/L (ref 38–126)
Anion gap: 7 (ref 5–15)
BUN: 15 mg/dL (ref 6–20)
CO2: 28 mmol/L (ref 22–32)
Calcium: 9.7 mg/dL (ref 8.9–10.3)
Chloride: 105 mmol/L (ref 98–111)
Creatinine, Ser: 0.85 mg/dL (ref 0.44–1.00)
GFR, Estimated: >60 mL/min (ref >60)
Glucose, Bld: 174 mg/dL — ABNORMAL HIGH (ref 70–99)
Potassium: 3.8 mmol/L (ref 3.5–5.1)
Sodium: 140 mmol/L (ref 135–145)
Total Bilirubin: 0.4 mg/dL (ref 0.3–1.2)
Total Protein: 8.2 g/dL — ABNORMAL HIGH (ref 6.5–8.1)

## 2021-12-23 LAB — IRON AND IRON BINDING CAPACITY (CC-WL,HP ONLY)
Iron: 59 ug/dL (ref 28–170)
Saturation Ratios: 15 % (ref 10.4–31.8)
TIBC: 406 ug/dL (ref 250–450)
UIBC: 347 ug/dL (ref 148–442)

## 2021-12-23 LAB — FERRITIN: Ferritin: 92 ng/mL (ref 11–307)

## 2021-12-23 NOTE — Progress Notes (Signed)
?[place] ?Telephone:([phone]   [phone] ? ?PROGRESS NOTE ? ?Patient Care Team: ?Wenda Low, MD as PCP - General (Internal Medicine) ? ?Hematological/Oncological History ?# Essential Thrombocytosis, JAK2 positive ?04/08/2021: JAK2 V617F positive on MPN panel. WBC 14.3, Hgb 11.2, MCV 69.2, Plt 418. Iron sat 5%.  ?  ?Interval History:  ?Ariel Taylor 61 y.o. female with medical history significant for JAK2 positive ET who presents for a follow up visit.  ?She denies any new health complaints.  She has been feeling quite well, moved to the [place] area.  No fevers, drenching night sweats, loss of appetite or loss of weight.  No recent infections or hospitalizations.  She has been taking her aspirin daily.  No change in bowel habits or urinary habits.  She is up-to-date with her colonoscopy. ?Last mammogram in May 2022, negative for malignancy.  Rest of the pertinent 10 point ROS reviewed and negative. ? ?MEDICAL HISTORY:  ?Past Medical History:  ?Diagnosis Date  ? Diabetes mellitus without complication ([place])   ? Hyperlipidemia   ? Hypertension   ? Mild atherosclerosis of carotid artery, left   ? ? ?SURGICAL HISTORY: ?Past Surgical History:  ?Procedure Laterality Date  ? ABDOMINAL HYSTERECTOMY    ? ? ?SOCIAL HISTORY: ?Social History  ? ?Socioeconomic History  ? Marital status: Single  ?  Spouse name: Not on file  ? Number of children: Not on file  ? Years of education: Not on file  ? Highest education level: Not on file  ?Occupational History  ? Not on file  ?Tobacco Use  ? Smoking status: Never  ? Smokeless tobacco: Never  ?Vaping Use  ? Vaping Use: Never used  ?Substance and Sexual Activity  ? Alcohol use: No  ?  Alcohol/week: 0.0 standard drinks  ? Drug use: No  ? Sexual activity: Not on file  ?Other Topics Concern  ? Not on file  ?Social History Narrative  ? Not on file  ? ?Social Determinants of Health  ? ?Financial Resource Strain: Not on file  ?Food Insecurity: Not on file   ?Transportation Needs: Not on file  ?Physical Activity: Not on file  ?Stress: Not on file  ?Social Connections: Not on file  ?Intimate Partner Violence: Not on file  ? ? ?FAMILY HISTORY: ?Family History  ?Problem Relation Age of Onset  ? Diabetes Other   ? Diabetes Mother   ? ? ?ALLERGIES:  is allergic to acetaminophen. ? ?MEDICATIONS:  ?Current Outpatient Medications  ?Medication Sig Dispense Refill  ? amLODipine (NORVASC) 5 MG tablet Take 5 mg by mouth daily.    ? ferrous sulfate 325 (65 FE) MG tablet Take 1 tablet (325 mg total) by mouth daily with breakfast. Please take with a source of Vitamin C 90 tablet 3  ? lisinopril (PRINIVIL,ZESTRIL) 10 MG tablet Take 10 mg by mouth daily.    ? metFORMIN (GLUCOPHAGE) 500 MG tablet Take 1 tablet by mouth 2 (two) times daily.    ? rosuvastatin (CRESTOR) 5 MG tablet Take 5 mg by mouth daily.    ? ?No current facility-administered medications for this visit.  ? ? ?PHYSICAL EXAMINATION: ? ?Vitals:  ? 12/23/21 0837  ?BP: 134/83  ?Pulse: 66  ?Resp: 18  ?Temp: 97.7 ?F (36.5 ?C)  ?SpO2: 100%  ? ? ?Filed Weights  ? 12/23/21 0837  ?Weight: 189 lb 11.2 oz (86 kg)  ? ?Physical Exam ?Constitutional:   ?   Appearance: Normal appearance.  ?Cardiovascular:  ?   Rate  and Rhythm: Normal rate and regular rhythm.  ?   Pulses: Normal pulses.  ?   Heart sounds: Normal heart sounds.  ?Pulmonary:  ?   Effort: Pulmonary effort is normal.  ?   Breath sounds: Normal breath sounds.  ?Abdominal:  ?   General: Abdomen is flat.  ?   Palpations: Abdomen is soft. There is no mass.  ?Musculoskeletal:     ?   General: No swelling or tenderness.  ?   Cervical back: Normal range of motion and neck supple. No rigidity.  ?Lymphadenopathy:  ?   Cervical: No cervical adenopathy.  ?Skin: ?   General: Skin is warm and dry.  ?Neurological:  ?   General: No focal deficit present.  ?   Mental Status: She is alert.  ? ? ? ?LABORATORY DATA:  ?I have reviewed the data as listed ? ?  Latest Ref Rng & Units 12/23/2021  ?   8:29 AM 08/17/2021  ?  1:54 PM 04/08/2021  ?  9:20 AM  ?CBC  ?WBC 4.0 - 10.5 K/uL 7.3   6.7   8.4    ?Hemoglobin 12.0 - 15.0 g/dL 10.7   10.6   10.3    ?Hematocrit 36.0 - 46.0 % 34.7   34.1   32.3    ?Platelets 150 - 400 K/uL 459   447   436    ? ? ? ?  Latest Ref Rng & Units 12/23/2021  ?  8:29 AM 08/17/2021  ?  1:54 PM 04/08/2021  ?  9:20 AM  ?CMP  ?Glucose 70 - 99 mg/dL 174   135   170    ?BUN 6 - 20 mg/dL '15   11   11    '$ ?Creatinine 0.44 - 1.00 mg/dL 0.85   0.88   0.86    ?Sodium 135 - 145 mmol/L 140   142   139    ?Potassium 3.5 - 5.1 mmol/L 3.8   3.7   4.0    ?Chloride 98 - 111 mmol/L 105   109   104    ?CO2 22 - 32 mmol/L '28   26   28    '$ ?Calcium 8.9 - 10.3 mg/dL 9.7   9.5   9.8    ?Total Protein 6.5 - 8.1 g/dL 8.2   8.0   7.9    ?Total Bilirubin 0.3 - 1.2 mg/dL 0.4   0.3   0.3    ?Alkaline Phos 38 - 126 U/L 101   107   116    ?AST 15 - 41 U/L '15   19   19    '$ ?ALT 0 - 44 U/L '17   21   21    '$ ? ? ?RADIOGRAPHIC STUDIES: ?No results found. ? ?ASSESSMENT & PLAN ?Ariel Taylor 61 y.o. female with medical history significant for JAK2 positive ET who presents for a follow up visit.  ? ?# Essential Thrombocytosis, JAK2 positive ?-- Patient is considered low risk, agree with treatment with aspirin therapy alone at this time. ?--No concerns since last visit.  Review of systems and physical examination unremarkable. ?--She will continue with aspirin, platelet count slightly higher than last time. ?She understands that we may have to use cytoreductive agents when she turns 62 since she is considered high risk at that point. ? ?#Microcytic hypochromic anemia.  No evidence of iron deficiency.  Hemoglobin fractionation cascade was negative for hemoglobin abnormalities.  Alpha thalassemia testing ordered.  I believe she most likely  has alpha thalassemia trait and hence the mild anemia with microcytosis. ? ?No orders of the defined types were placed in this encounter. ?Age appropriate cancer screening advised.  Return to  clinic in 6 months. ? ?All questions were answered. The patient knows to call the clinic with any problems, questions or concerns. ? ?A total of more than 30 minutes were spent on this encounter with face-to-face time and non-face-to-face time, including preparing to see the patient, ordering tests and/or medications, counseling the patient and coordination of care as outlined above.  ? ?Benay Pike MD ? ? ?12/23/2021 9:26 AM ? ?

## 2021-12-27 ENCOUNTER — Telehealth: Payer: Self-pay | Admitting: Hematology and Oncology

## 2021-12-27 NOTE — Telephone Encounter (Signed)
Scheduled appointment per 03/31 los. Left message.   ?

## 2022-01-05 LAB — ALPHA-THALASSEMIA GENOTYPR

## 2022-03-10 ENCOUNTER — Ambulatory Visit: Payer: Managed Care, Other (non HMO)

## 2022-03-10 ENCOUNTER — Encounter: Payer: Self-pay | Admitting: Cardiology

## 2022-03-10 DIAGNOSIS — I6522 Occlusion and stenosis of left carotid artery: Secondary | ICD-10-CM

## 2022-03-16 ENCOUNTER — Ambulatory Visit: Payer: Managed Care, Other (non HMO) | Admitting: Cardiology

## 2022-03-17 ENCOUNTER — Ambulatory Visit: Payer: Self-pay | Admitting: Cardiology

## 2022-04-12 ENCOUNTER — Ambulatory Visit: Payer: Managed Care, Other (non HMO) | Admitting: Cardiology

## 2022-06-16 ENCOUNTER — Telehealth: Payer: Self-pay | Admitting: Hematology and Oncology

## 2022-06-16 NOTE — Telephone Encounter (Signed)
Contacted patient to scheduled appointments. Left message with appointment details and a call back number if patient had any questions or could not accommodate the time we provided.   

## 2022-06-23 ENCOUNTER — Ambulatory Visit: Payer: Managed Care, Other (non HMO) | Admitting: Hematology and Oncology

## 2022-06-30 ENCOUNTER — Inpatient Hospital Stay: Payer: Managed Care, Other (non HMO) | Attending: Hematology and Oncology | Admitting: Hematology and Oncology

## 2022-08-05 IMAGING — MG MM DIGITAL SCREENING BILAT W/ TOMO AND CAD
8 series · 8 of 24 positions shown · non-contrast
Comparison: Previous exam(s).

CLINICAL DATA: Screening.

EXAM:
DIGITAL SCREENING BILATERAL MAMMOGRAM WITH TOMOSYNTHESIS AND CAD
TECHNIQUE: Bilateral screening digital craniocaudal and mediolateral oblique
mammograms were obtained. Bilateral screening digital breast
tomosynthesis was performed. The images were evaluated with
computer-aided detection.

[R MLO synth-2D]
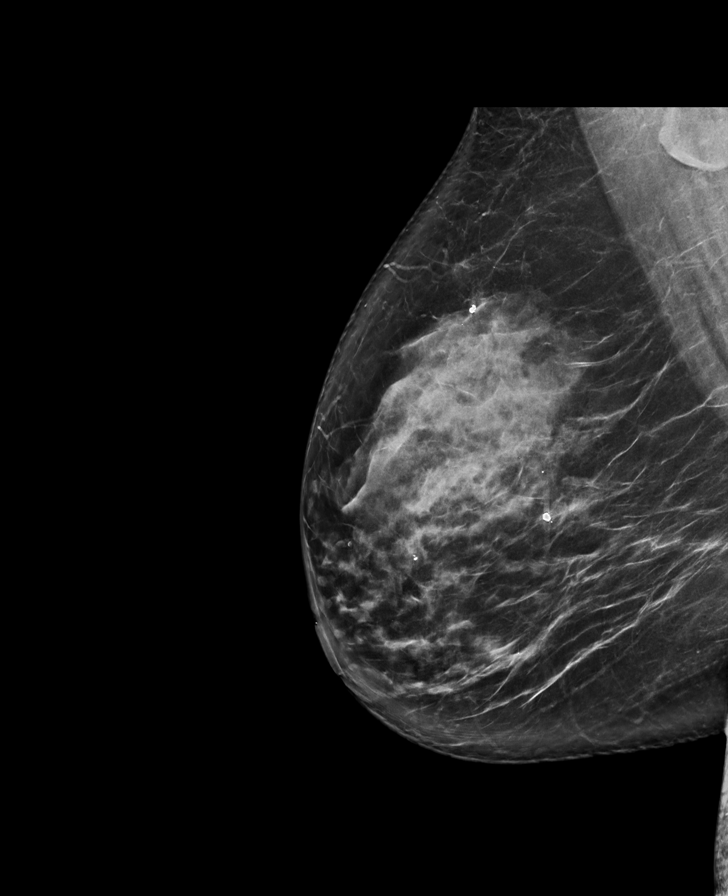

[R CC synth-2D]
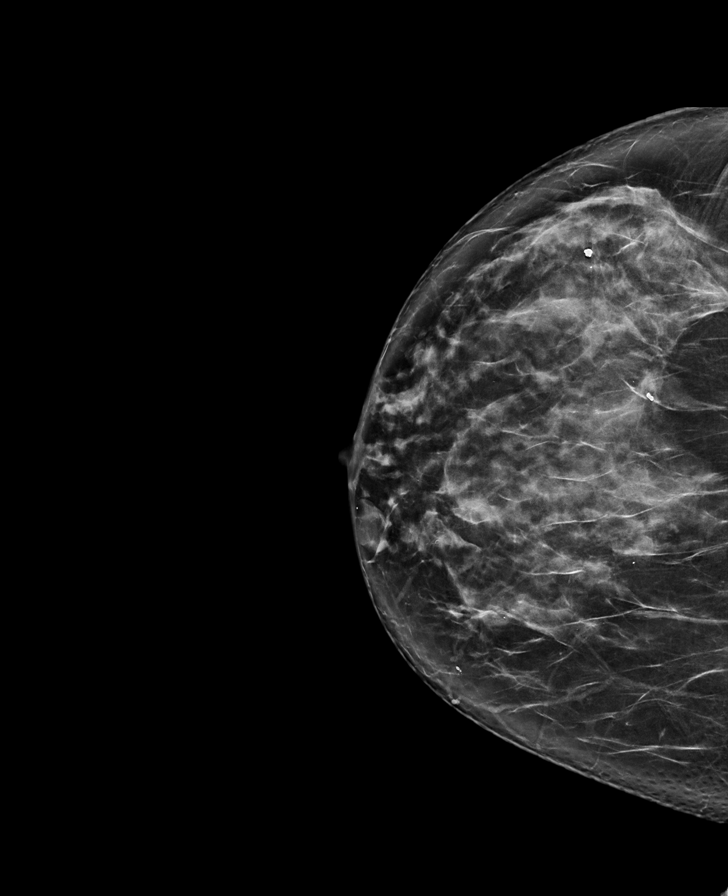

[L MLO synth-2D]
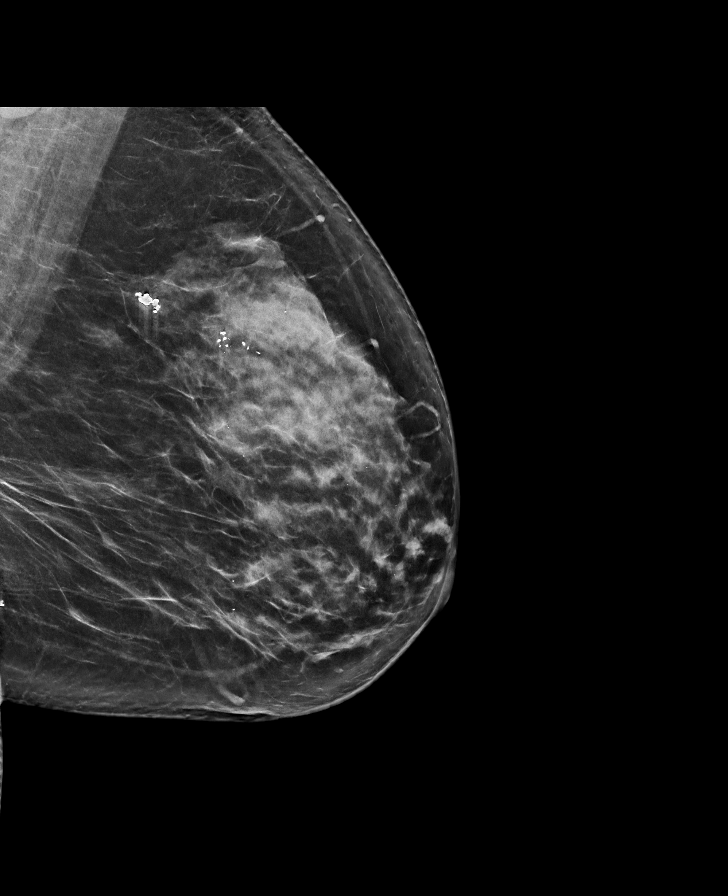

[L CC synth-2D]
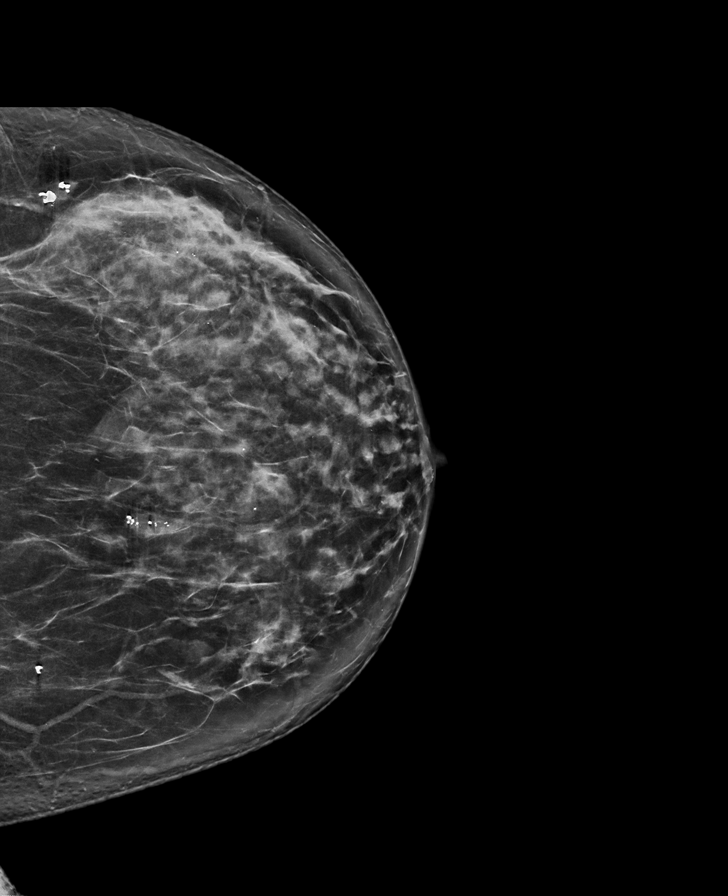

[L CC tomo · tomo slice 41/81.0]
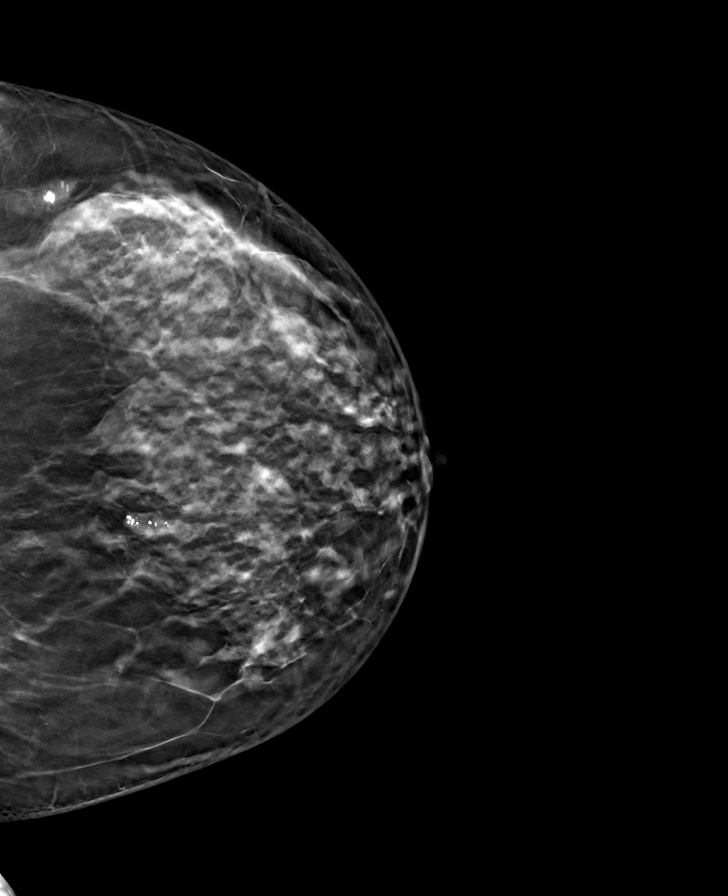

[R MLO tomo · tomo slice 47/94.0]
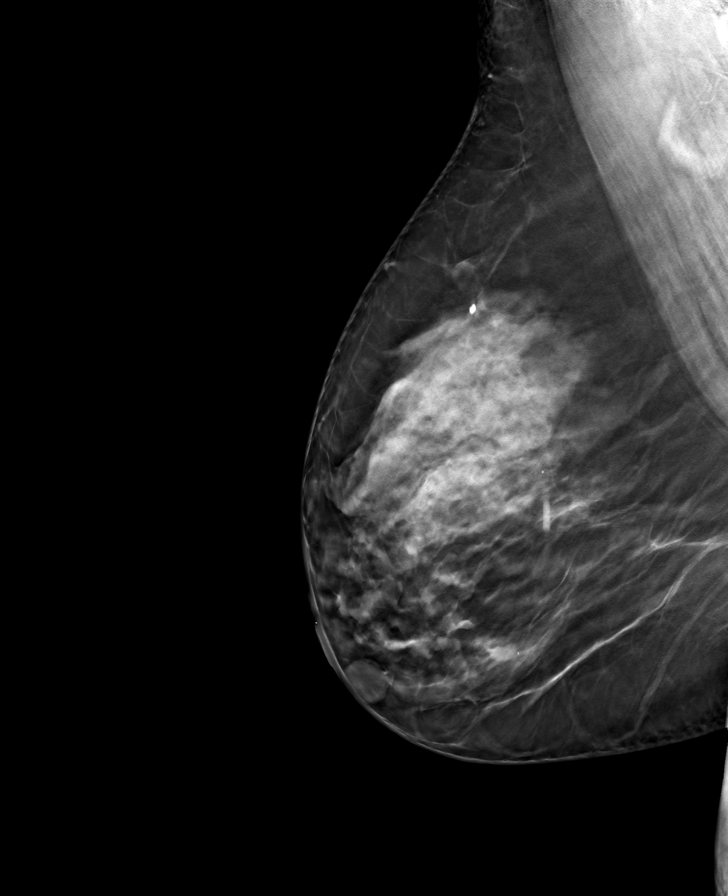

[L MLO tomo · tomo slice 46/91.0]
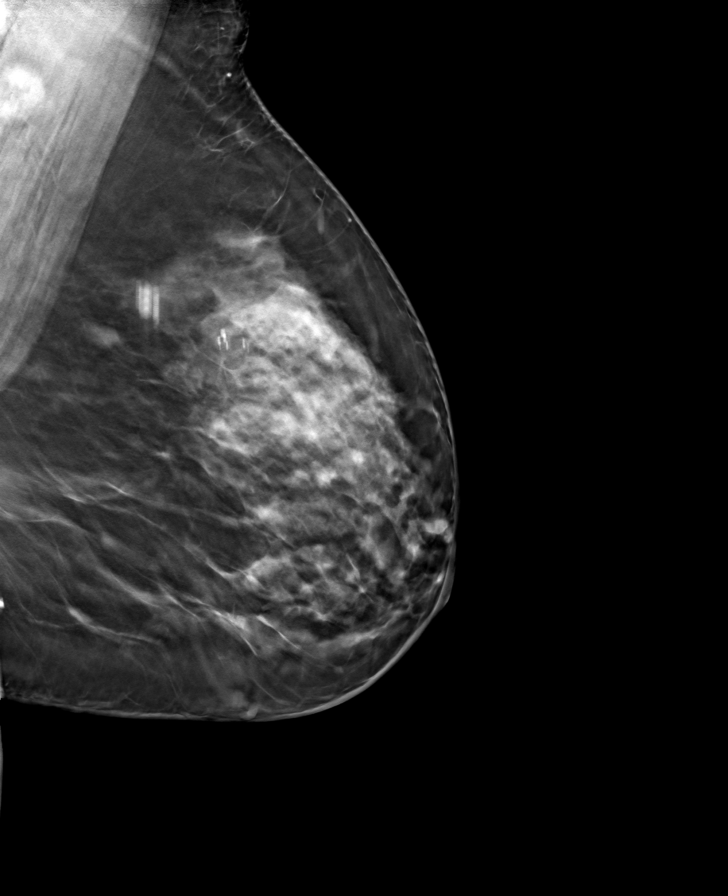

[R CC tomo · tomo slice 40/79.0]
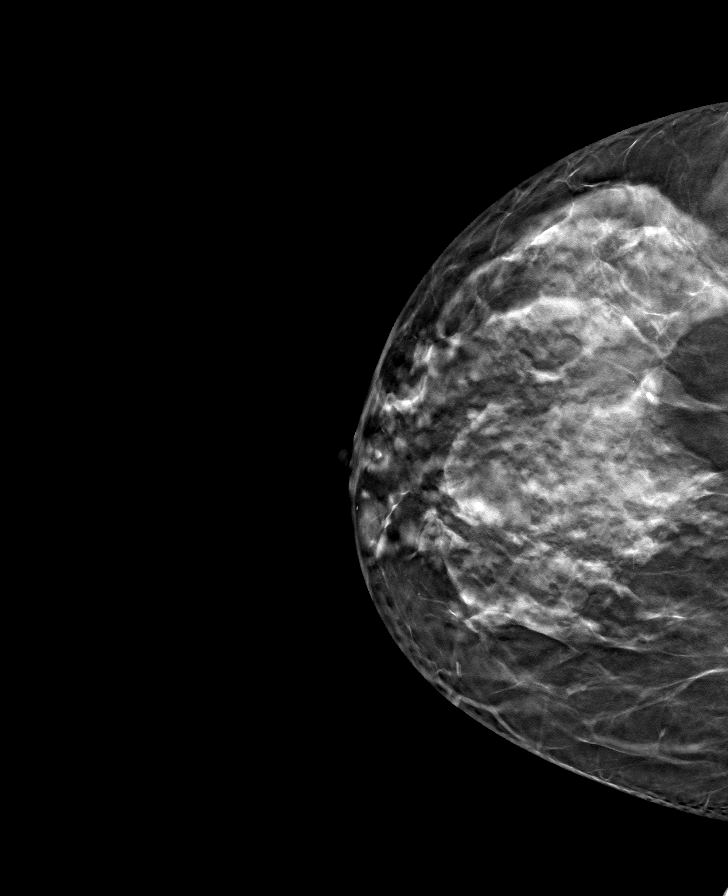

[8 of 24 positions shown; findings below may reference images not displayed]

ACR Breast Density Category c: The breast tissue is heterogeneously
dense, which may obscure small masses.
FINDINGS: There are no findings suspicious for malignancy. The images were
evaluated with computer-aided detection.
IMPRESSION: No mammographic evidence of malignancy. A result letter of this
screening mammogram will be mailed directly to the patient.

RECOMMENDATION:
Screening mammogram in one year. (Code:T4-5-GWO)

BI-RADS CATEGORY  1: Negative.

## 2022-09-11 ENCOUNTER — Ambulatory Visit: Payer: Managed Care, Other (non HMO) | Admitting: Hematology and Oncology

## 2022-09-13 ENCOUNTER — Other Ambulatory Visit: Payer: Self-pay | Admitting: Internal Medicine

## 2022-09-13 DIAGNOSIS — Z1231 Encounter for screening mammogram for malignant neoplasm of breast: Secondary | ICD-10-CM

## 2022-09-14 ENCOUNTER — Ambulatory Visit
Admission: RE | Admit: 2022-09-14 | Discharge: 2022-09-14 | Disposition: A | Discharge status: Home / Self Care | Payer: Managed Care, Other (non HMO) | Source: Ambulatory Visit | Attending: Internal Medicine | Admitting: Internal Medicine

## 2022-09-14 DIAGNOSIS — Z1231 Encounter for screening mammogram for malignant neoplasm of breast: Secondary | ICD-10-CM

## 2022-09-28 ENCOUNTER — Inpatient Hospital Stay: Payer: Managed Care, Other (non HMO) | Attending: Hematology and Oncology | Admitting: Hematology and Oncology

## 2022-09-28 DIAGNOSIS — D473 Essential (hemorrhagic) thrombocythemia: Secondary | ICD-10-CM

## 2022-09-28 DIAGNOSIS — D563 Thalassemia minor: Secondary | ICD-10-CM

## 2022-09-28 NOTE — Progress Notes (Signed)
[place] [phone]   [phone]  PROGRESS NOTE  Patient Care Team: Wenda Low, MD as PCP - General (Internal Medicine)  Hematological/Oncological History # Essential Thrombocytosis, JAK2 positive 04/08/2021: JAK2 V617F positive on MPN panel. WBC 14.3, Hgb 11.2, MCV 69.2, Plt 418. Iron sat 5%.    Interval History:  Ariel Taylor 62 y.o. female with medical history significant for JAK2 positive ET who presents for a telephone follow up.  [place] is now in the [place] area. She still wants to keep coming back to [facility] for lab appts. She says she is feeling well. No B symptoms. No recent infections. No change in medications. She had labs in December, plt count was in 300K at Dr Husain's office.  MEDICAL HISTORY:  Past Medical History:  Diagnosis Date   Diabetes mellitus without complication (HCC)    Hyperlipidemia    Hypertension    Mild atherosclerosis of carotid artery, left     SURGICAL HISTORY: Past Surgical History:  Procedure Laterality Date   ABDOMINAL HYSTERECTOMY      SOCIAL HISTORY: Social History   Socioeconomic History   Marital status: Single    Spouse name: Not on file   Number of children: Not on file   Years of education: Not on file   Highest education level: Not on file  Occupational History   Not on file  Tobacco Use   Smoking status: Never   Smokeless tobacco: Never  Vaping Use   Vaping Use: Never used  Substance and Sexual Activity   Alcohol use: No    Alcohol/week: 0.0 standard drinks of alcohol   Drug use: No   Sexual activity: Not on file  Other Topics Concern   Not on file  Social History Narrative   Not on file   Social Determinants of Health   Financial Resource Strain: Not on file  Food Insecurity: Not on file  Transportation Needs: Not on file  Physical Activity: Not on file  Stress: Not on file  Social Connections: Not on file  Intimate Partner Violence: Not on file    FAMILY  HISTORY: Family History  Problem Relation Age of Onset   Diabetes Other    Diabetes Mother     ALLERGIES:  is allergic to acetaminophen.  MEDICATIONS:  Current Outpatient Medications  Medication Sig Dispense Refill   amLODipine (NORVASC) 5 MG tablet Take 5 mg by mouth daily.     ferrous sulfate 325 (65 FE) MG tablet Take 1 tablet (325 mg total) by mouth daily with breakfast. Please take with a source of Vitamin C 90 tablet 3   lisinopril (PRINIVIL,ZESTRIL) 10 MG tablet Take 10 mg by mouth daily.     metFORMIN (GLUCOPHAGE) 500 MG tablet Take 1 tablet by mouth 2 (two) times daily.     rosuvastatin (CRESTOR) 5 MG tablet Take 5 mg by mouth daily.     No current facility-administered medications for this visit.    PHYSICAL EXAMINATION:  There were no vitals filed for this visit.   There were no vitals filed for this visit. PE not done, telephone visit   LABORATORY DATA:  I have reviewed the data as listed    Latest Ref Rng & Units 12/23/2021    8:29 AM 08/17/2021    1:54 PM 04/08/2021    9:20 AM  CBC  WBC 4.0 - 10.5 K/uL 7.3  6.7  8.4   Hemoglobin 12.0 - 15.0 g/dL 10.7  10.6  10.3   Hematocrit  36.0 - 46.0 % 34.7  34.1  32.3   Platelets 150 - 400 K/uL 459  447  436        Latest Ref Rng & Units 12/23/2021    8:29 AM 08/17/2021    1:54 PM 04/08/2021    9:20 AM  CMP  Glucose 70 - 99 mg/dL 174  135  170   BUN 6 - 20 mg/dL '15  11  11   '$ Creatinine 0.44 - 1.00 mg/dL 0.85  0.88  0.86   Sodium 135 - 145 mmol/L 140  142  139   Potassium 3.5 - 5.1 mmol/L 3.8  3.7  4.0   Chloride 98 - 111 mmol/L 105  109  104   CO2 22 - 32 mmol/L '28  26  28   '$ Calcium 8.9 - 10.3 mg/dL 9.7  9.5  9.8   Total Protein 6.5 - 8.1 g/dL 8.2  8.0  7.9   Total Bilirubin 0.3 - 1.2 mg/dL 0.4  0.3  0.3   Alkaline Phos 38 - 126 U/L 101  107  116   AST 15 - 41 U/L '15  19  19   '$ ALT 0 - 44 U/L '17  21  21     '$ RADIOGRAPHIC STUDIES: MM 3D SCREEN BREAST BILATERAL  Result Date: 09/14/2022 CLINICAL DATA:   Screening. EXAM: DIGITAL SCREENING BILATERAL MAMMOGRAM WITH TOMOSYNTHESIS AND CAD TECHNIQUE: Bilateral screening digital craniocaudal and mediolateral oblique mammograms were obtained. Bilateral screening digital breast tomosynthesis was performed. The images were evaluated with computer-aided detection. COMPARISON:  Previous exam(s). ACR Breast Density Category c: The breast tissue is heterogeneously dense, which may obscure small masses. FINDINGS: There are no findings suspicious for malignancy. IMPRESSION: No mammographic evidence of malignancy. A result letter of this screening mammogram will be mailed directly to the patient. RECOMMENDATION: Screening mammogram in one year. (Code:SM-B-01Y) BI-RADS CATEGORY  1: Negative. Electronically Signed   By: Margarette Canada M.D.   On: 09/14/2022 14:48    ASSESSMENT & PLAN  Ariel Taylor 62 y.o. female with medical history significant for JAK2 positive ET who presents for a follow up visit.   # Essential Thrombocytosis, JAK2 positive -- on aspirin alone. -- No new B symptoms. --She will be back in [place] in March. Physical examination not done, telephone visit, we once again discussed about significance of ET and the risk of thrombosis.  She understands that when she turns 51, she may be considered high risk because of age and we may have to consider some cytoreductive agents depending on her blood counts. She was encouraged to call us sooner with any new questions or concerns.  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  She has no children.  We have discussed that this is a carrier state and this should not have any impact on her survival.  No orders of the defined types were placed in this encounter.  I connected with  Chanteria Haggard on 09/28/22 by a telephone application and verified that I am speaking with the correct person using two identifiers.   I discussed the limitations  of evaluation and management by telemedicine. The patient expressed understanding and agreed to proceed.  Total time spent: 10 minutes including history, review of records, counseling and coordination of care.  We will send request to Dr. Sherilyn Cooter office to share labs from December.  Benay Pike MD   09/28/2022 8:49 AM

## 2022-10-14 ENCOUNTER — Encounter: Payer: Self-pay | Admitting: Hematology and Oncology

## 2022-10-24 ENCOUNTER — Encounter: Payer: Self-pay | Admitting: Hematology and Oncology

## 2022-12-06 ENCOUNTER — Encounter: Payer: Self-pay | Admitting: Hematology and Oncology

## 2022-12-07 ENCOUNTER — Other Ambulatory Visit: Payer: Self-pay | Admitting: *Deleted

## 2022-12-07 DIAGNOSIS — D473 Essential (hemorrhagic) thrombocythemia: Secondary | ICD-10-CM

## 2022-12-07 DIAGNOSIS — D563 Thalassemia minor: Secondary | ICD-10-CM

## 2022-12-11 ENCOUNTER — Telehealth: Payer: Self-pay | Admitting: Hematology and Oncology

## 2022-12-11 NOTE — Telephone Encounter (Signed)
Called patient to let her know about her appointment change, left voicemail.

## 2023-01-04 ENCOUNTER — Other Ambulatory Visit: Payer: Self-pay

## 2023-01-04 ENCOUNTER — Ambulatory Visit: Payer: Self-pay | Admitting: Hematology and Oncology

## 2023-03-19 ENCOUNTER — Inpatient Hospital Stay: Payer: BLUE CROSS/BLUE SHIELD | Attending: Hematology and Oncology

## 2023-03-19 ENCOUNTER — Inpatient Hospital Stay: Payer: BLUE CROSS/BLUE SHIELD | Admitting: Hematology and Oncology

## 2023-03-19 NOTE — Progress Notes (Deleted)
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 WBC 14.3, Hgb 11.2, MCV 69.2, Plt 418. Iron sat 5%.    Interval History:  Ariel Taylor 62 y.o. female with medical history significant for JAK2 positive ET who presents for a telephone follow up.  She is now in the [place] area. She still wants to keep coming back to [facility] for lab appts. She says she is feeling well. No B symptoms. No recent infections. No change in medications. She had labs in December, plt count was in 300K at Dr Husain's office.  MEDICAL HISTORY:  Past Medical History:  Diagnosis Date   Diabetes mellitus without complication (HCC)    Hyperlipidemia    Hypertension    Mild atherosclerosis of carotid artery, left     SURGICAL HISTORY: Past Surgical History:  Procedure Laterality Date   ABDOMINAL HYSTERECTOMY      SOCIAL HISTORY: Social History   Socioeconomic History   Marital status: Single    Spouse name: Not on file   Number of children: Not on file   Years of education: Not on file   Highest education level: Not on file  Occupational History   Not on file  Tobacco Use   Smoking status: Never   Smokeless tobacco: Never  Vaping Use   Vaping Use: Never used  Substance and Sexual Activity   Alcohol use: No    Alcohol/week: 0.0 standard drinks of alcohol   Drug use: No   Sexual activity: Not on file  Other Topics Concern   Not on file  Social History Narrative   Not on file   Social Determinants of Health   Financial Resource Strain: Not on file  Food Insecurity: Not on file  Transportation Needs: Not on file  Physical Activity: Not on file  Stress: Not on file  Social Connections: Not on file  Intimate Partner Violence: Not on file     FAMILY HISTORY: Family History  Problem Relation Age of Onset   Diabetes Other    Diabetes Mother     ALLERGIES:  is allergic to acetaminophen.  MEDICATIONS:  Current Outpatient Medications  Medication Sig Dispense Refill   amLODipine (NORVASC) 5 MG tablet Take 5 mg by mouth daily.     ferrous sulfate 325 (65 FE) MG tablet Take 1 tablet (325 mg total) by mouth daily with breakfast. Please take with a source of Vitamin C 90 tablet 3   lisinopril (PRINIVIL,ZESTRIL) 10 MG tablet Take 10 mg by mouth daily.     metFORMIN (GLUCOPHAGE) 500 MG tablet Take 1 tablet by mouth 2 (two) times daily.     rosuvastatin (CRESTOR) 5 MG tablet Take 5 mg by mouth daily.     No current facility-administered medications for this visit.    PHYSICAL EXAMINATION:  There were no vitals filed for this visit.   There were no vitals filed for this visit. PE not done, telephone visit   LABORATORY DATA:  I have reviewed the data as listed    Latest Ref Rng & Units 12/23/2021    8:29 AM 08/17/2021    1:54 PM 04/08/2021    9:20 AM  CBC  WBC 4.0 - 10.5 K/uL 7.3  6.7  8.4   Hemoglobin 12.0 - 15.0 g/dL 45.4  09.8  11.9  Hematocrit 36.0 - 46.0 % 34.7  34.1  32.3   Platelets 150 - 400 K/uL 459  447  436        Latest Ref Rng & Units 12/23/2021    8:29 AM 08/17/2021    1:54 PM 04/08/2021    9:20 AM  CMP  Glucose 70 - 99 mg/dL 235  573  220   BUN 6 - 20 mg/dL 15  11  11    Creatinine 0.44 - 1.00 mg/dL 2.54  2.70  6.23   Sodium 135 - 145 mmol/L 140  142  139   Potassium 3.5 - 5.1 mmol/L 3.8  3.7  4.0   Chloride 98 - 111 mmol/L 105  109  104   CO2 22 - 32 mmol/L 28  26  28    Calcium 8.9 - 10.3 mg/dL 9.7  9.5  9.8   Total Protein 6.5 - 8.1 g/dL 8.2  8.0  7.9   Total Bilirubin 0.3 - 1.2 mg/dL 0.4  0.3  0.3   Alkaline Phos 38 - 126 U/L 101  107  116   AST 15 - 41 U/L 15  19  19    ALT 0 - 44 U/L 17  21  21      RADIOGRAPHIC STUDIES: No results found.  ASSESSMENT & PLAN  Ariel Taylor 62  y.o. female with medical history significant for JAK2 positive ET who presents for a follow up visit.   # Essential Thrombocytosis, JAK2 positive -- on aspirin alone. -- No new B symptoms. --She will be back in [place] in March. Physical examination not done, telephone visit, we once again discussed about significance of ET and the risk of thrombosis.  She understands that when she turns 24, she may be considered high risk because of age and we may have to consider some cytoreductive agents depending on her blood counts. She was encouraged to call us sooner with any new questions or concerns.  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  She has no children.  We have discussed that this is a carrier state and this should not have any impact on her survival.  No orders of the defined types were placed in this encounter.  I connected with  Ariel Taylor on 03/19/23 by a telephone application and verified that I am speaking with the correct person using two identifiers.   I discussed the limitations of evaluation and management by telemedicine. The patient expressed understanding and agreed to proceed.  Total time spent: 10 minutes including history, review of records, counseling and coordination of care.  We will send request to Dr. Paulita Fujita office to share labs from December.  Rachel Moulds MD   03/19/2023 9:16 AM

## 2023-04-10 ENCOUNTER — Other Ambulatory Visit: Payer: Self-pay | Admitting: *Deleted

## 2023-04-10 DIAGNOSIS — D509 Iron deficiency anemia, unspecified: Secondary | ICD-10-CM

## 2023-04-10 DIAGNOSIS — D75839 Thrombocytosis, unspecified: Secondary | ICD-10-CM

## 2023-04-11 ENCOUNTER — Other Ambulatory Visit: Payer: Self-pay

## 2023-04-11 ENCOUNTER — Inpatient Hospital Stay: Payer: Self-pay | Attending: Hematology and Oncology

## 2023-04-11 ENCOUNTER — Ambulatory Visit: Payer: Self-pay | Admitting: Adult Health

## 2023-04-11 DIAGNOSIS — D473 Essential (hemorrhagic) thrombocythemia: Secondary | ICD-10-CM | POA: Insufficient documentation

## 2023-04-11 DIAGNOSIS — D509 Iron deficiency anemia, unspecified: Secondary | ICD-10-CM | POA: Insufficient documentation

## 2023-04-11 DIAGNOSIS — D75839 Thrombocytosis, unspecified: Secondary | ICD-10-CM

## 2023-04-11 LAB — CBC WITH DIFFERENTIAL (CANCER CENTER ONLY)
Abs Immature Granulocytes: 0.04 10*3/uL (ref 0.00–0.07)
Basophils Absolute: 0 10*3/uL (ref 0.0–0.1)
Basophils Relative: 1 %
Eosinophils Absolute: 0.2 10*3/uL (ref 0.0–0.5)
Eosinophils Relative: 3 %
HCT: 34.2 % — ABNORMAL LOW (ref 36.0–46.0)
Hemoglobin: 10.7 g/dL — ABNORMAL LOW (ref 12.0–15.0)
Immature Granulocytes: 1 %
Lymphocytes Relative: 24 %
Lymphs Abs: 2 10*3/uL (ref 0.7–4.0)
MCH: 21.7 pg — ABNORMAL LOW (ref 26.0–34.0)
MCHC: 31.3 g/dL (ref 30.0–36.0)
MCV: 69.5 fL — ABNORMAL LOW (ref 80.0–100.0)
Monocytes Absolute: 0.7 10*3/uL (ref 0.1–1.0)
Monocytes Relative: 8 %
Neutro Abs: 5.5 10*3/uL (ref 1.7–7.7)
Neutrophils Relative %: 63 %
Platelet Count: 450 10*3/uL — ABNORMAL HIGH (ref 150–400)
RBC: 4.92 MIL/uL (ref 3.87–5.11)
RDW: 16.3 % — ABNORMAL HIGH (ref 11.5–15.5)
WBC Count: 8.5 10*3/uL (ref 4.0–10.5)
nRBC: 0 % (ref 0.0–0.2)

## 2023-04-11 LAB — CMP (CANCER CENTER ONLY)
ALT: 27 U/L (ref 0–44)
AST: 25 U/L (ref 15–41)
Albumin: 4.4 g/dL (ref 3.5–5.0)
Alkaline Phosphatase: 94 U/L (ref 38–126)
Anion gap: 8 (ref 5–15)
BUN: 16 mg/dL (ref 6–20)
CO2: 29 mmol/L (ref 22–32)
Calcium: 10.1 mg/dL (ref 8.9–10.3)
Chloride: 105 mmol/L (ref 98–111)
Creatinine: 0.99 mg/dL (ref 0.44–1.00)
GFR, Estimated: >60 mL/min (ref >60)
Glucose, Bld: 121 mg/dL — ABNORMAL HIGH (ref 70–99)
Potassium: 4.1 mmol/L (ref 3.5–5.1)
Sodium: 142 mmol/L (ref 135–145)
Total Bilirubin: 0.3 mg/dL (ref 0.3–1.2)
Total Protein: 7.9 g/dL (ref 6.5–8.1)

## 2023-04-19 ENCOUNTER — Telehealth: Payer: Self-pay | Admitting: Hematology and Oncology

## 2023-04-19 NOTE — Telephone Encounter (Signed)
Patient is aware of rescheduled appointment dates/times 

## 2023-06-25 ENCOUNTER — Inpatient Hospital Stay (HOSPITAL_BASED_OUTPATIENT_CLINIC_OR_DEPARTMENT_OTHER): Payer: Self-pay | Admitting: Hematology and Oncology

## 2023-06-25 ENCOUNTER — Inpatient Hospital Stay: Payer: Self-pay | Attending: Hematology and Oncology

## 2023-06-25 ENCOUNTER — Other Ambulatory Visit: Payer: Self-pay

## 2023-06-25 VITALS — BP 152/73 | HR 74 | Temp 98.1°F | Resp 17 | Wt 189.1 lb

## 2023-06-25 DIAGNOSIS — D509 Iron deficiency anemia, unspecified: Secondary | ICD-10-CM

## 2023-06-25 DIAGNOSIS — Z1239 Encounter for other screening for malignant neoplasm of breast: Secondary | ICD-10-CM

## 2023-06-25 DIAGNOSIS — D563 Thalassemia minor: Secondary | ICD-10-CM | POA: Insufficient documentation

## 2023-06-25 DIAGNOSIS — D473 Essential (hemorrhagic) thrombocythemia: Secondary | ICD-10-CM | POA: Insufficient documentation

## 2023-06-25 DIAGNOSIS — Z1231 Encounter for screening mammogram for malignant neoplasm of breast: Secondary | ICD-10-CM

## 2023-06-25 DIAGNOSIS — D75839 Thrombocytosis, unspecified: Secondary | ICD-10-CM | POA: Insufficient documentation

## 2023-06-25 LAB — CMP (CANCER CENTER ONLY)
ALT: 21 U/L (ref 0–44)
AST: 20 U/L (ref 15–41)
Albumin: 4.1 g/dL (ref 3.5–5.0)
Alkaline Phosphatase: 103 U/L (ref 38–126)
Anion gap: 5 (ref 5–15)
BUN: 10 mg/dL (ref 6–20)
CO2: 29 mmol/L (ref 22–32)
Calcium: 9.7 mg/dL (ref 8.9–10.3)
Chloride: 106 mmol/L (ref 98–111)
Creatinine: 0.83 mg/dL (ref 0.44–1.00)
GFR, Estimated: >60 mL/min (ref >60)
Glucose, Bld: 282 mg/dL — ABNORMAL HIGH (ref 70–99)
Potassium: 4.1 mmol/L (ref 3.5–5.1)
Sodium: 140 mmol/L (ref 135–145)
Total Bilirubin: 0.4 mg/dL (ref 0.3–1.2)
Total Protein: 7.5 g/dL (ref 6.5–8.1)

## 2023-06-25 LAB — CBC WITH DIFFERENTIAL (CANCER CENTER ONLY)
Abs Immature Granulocytes: 0.02 10*3/uL (ref 0.00–0.07)
Basophils Absolute: 0 10*3/uL (ref 0.0–0.1)
Basophils Relative: 1 %
Eosinophils Absolute: 0.2 10*3/uL (ref 0.0–0.5)
Eosinophils Relative: 4 %
HCT: 34.5 % — ABNORMAL LOW (ref 36.0–46.0)
Hemoglobin: 10.8 g/dL — ABNORMAL LOW (ref 12.0–15.0)
Immature Granulocytes: 0 %
Lymphocytes Relative: 23 %
Lymphs Abs: 1.3 10*3/uL (ref 0.7–4.0)
MCH: 22.1 pg — ABNORMAL LOW (ref 26.0–34.0)
MCHC: 31.3 g/dL (ref 30.0–36.0)
MCV: 70.7 fL — ABNORMAL LOW (ref 80.0–100.0)
Monocytes Absolute: 0.6 10*3/uL (ref 0.1–1.0)
Monocytes Relative: 10 %
Neutro Abs: 3.4 10*3/uL (ref 1.7–7.7)
Neutrophils Relative %: 62 %
Platelet Count: 418 10*3/uL — ABNORMAL HIGH (ref 150–400)
RBC: 4.88 MIL/uL (ref 3.87–5.11)
RDW: 16.1 % — ABNORMAL HIGH (ref 11.5–15.5)
WBC Count: 5.5 10*3/uL (ref 4.0–10.5)
nRBC: 0 % (ref 0.0–0.2)

## 2023-06-25 LAB — IRON AND IRON BINDING CAPACITY (CC-WL,HP ONLY)
Iron: 64 ug/dL (ref 28–170)
Saturation Ratios: 16 % (ref 10.4–31.8)
TIBC: 413 ug/dL (ref 250–450)
UIBC: 349 ug/dL (ref 148–442)

## 2023-06-25 LAB — FERRITIN: Ferritin: 56 ng/mL (ref 11–307)

## 2023-06-25 NOTE — Progress Notes (Signed)
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   Interval History:  Ariel Taylor 62 y.o. female with medical history significant for JAK2 positive ET who presents for a follow up.  She lives in [place] but would like to continue follow-up in [place].  She flies from time to time to see all her doctors and have all her medical appointments.  Since her last visit here, she denies any complaints at all.  No fevers, not drenching night sweats, loss of appetite or loss of weight.  No early satiety, left upper quadrant abdominal pain.  No real history of DVT/PE.  She did not have to go to the hospital for anything since her last visit here.  She is up-to-date with her age-appropriate cancer screening.  She is very reluctant to try Hydrea.  She continues on baby aspirin.  She tells me that she had been taking oral iron.  MEDICAL HISTORY:  Past Medical History:  Diagnosis Date   Diabetes mellitus without complication (HCC)    Hyperlipidemia    Hypertension    Mild atherosclerosis of carotid artery, left     SURGICAL HISTORY: Past Surgical History:  Procedure Laterality Date   ABDOMINAL HYSTERECTOMY      SOCIAL HISTORY: Social History   Socioeconomic History   Marital status: Single    Spouse name: Not on file   Number of children: Not on file   Years of education: Not on file   Highest education level: Not on file  Occupational History   Not on file  Tobacco Use   Smoking status: Never   Smokeless tobacco: Never  Vaping Use   Vaping status: Never Used  Substance and Sexual Activity   Alcohol use: No    Alcohol/week: 0.0 standard drinks of alcohol   Drug use: No   Sexual activity: Not on file  Other Topics Concern   Not on  file  Social History Narrative   Not on file   Social Determinants of Health   Financial Resource Strain: Not on file  Food Insecurity: Not on file  Transportation Needs: Not on file  Physical Activity: Not on file  Stress: Not on file  Social Connections: Not on file  Intimate Partner Violence: Not on file    FAMILY HISTORY: Family History  Problem Relation Age of Onset   Diabetes Other    Diabetes Mother     ALLERGIES:  is allergic to acetaminophen.  MEDICATIONS:  Current Outpatient Medications  Medication Sig Dispense Refill   amLODipine (NORVASC) 5 MG tablet Take 5 mg by mouth daily.     ferrous sulfate 325 (65 FE) MG tablet Take 1 tablet (325 mg total) by mouth daily with breakfast. Please take with a source of Vitamin C 90 tablet 3   lisinopril (PRINIVIL,ZESTRIL) 10 MG tablet Take 10 mg by mouth daily.     metFORMIN (GLUCOPHAGE) 500 MG tablet Take 1 tablet by mouth 2 (two) times daily.     rosuvastatin (CRESTOR) 5 MG tablet Take 5 mg by mouth daily.     No current facility-administered medications for this visit.    PHYSICAL EXAMINATION:  Vitals:   06/25/23 0910 06/25/23 0912  BP: (!) 161/75 (!) 152/73  Pulse: 73 74  Resp: 17   Temp: 98.1 F (36.7 C)  SpO2: 100%      Filed Weights   06/25/23 0910  Weight: 189 lb 1.6 oz (85.8 kg)   Physical Exam Constitutional:      Appearance: Normal appearance.  Cardiovascular:     Rate and Rhythm: Normal rate and regular rhythm.     Pulses: Normal pulses.     Heart sounds: Normal heart sounds.  Pulmonary:     Effort: Pulmonary effort is normal.     Breath sounds: Normal breath sounds.  Abdominal:     General: Abdomen is flat.     Palpations: Abdomen is soft.  Musculoskeletal:        General: No swelling.     Cervical back: Normal range of motion. No rigidity.  Lymphadenopathy:     Cervical: No cervical adenopathy.  Skin:    General: Skin is warm and dry.  Neurological:     General: No focal deficit  present.     Mental Status: She is alert.       LABORATORY DATA:  I have reviewed the data as listed    Latest Ref Rng & Units 06/25/2023    8:42 AM 04/11/2023    3:28 PM 12/23/2021    8:29 AM  CBC  WBC 4.0 - 10.5 K/uL 5.5  8.5  7.3   Hemoglobin 12.0 - 15.0 g/dL 24.4  01.0  27.2   Hematocrit 36.0 - 46.0 % 34.5  34.2  34.7   Platelets 150 - 400 K/uL 418  450  459        Latest Ref Rng & Units 06/25/2023    8:42 AM 04/11/2023    3:28 PM 12/23/2021    8:29 AM  CMP  Glucose 70 - 99 mg/dL 536  644  034   BUN 6 - 20 mg/dL 10  16  15    Creatinine 0.44 - 1.00 mg/dL 7.42  5.95  6.38   Sodium 135 - 145 mmol/L 140  142  140   Potassium 3.5 - 5.1 mmol/L 4.1  4.1  3.8   Chloride 98 - 111 mmol/L 106  105  105   CO2 22 - 32 mmol/L 29  29  28    Calcium 8.9 - 10.3 mg/dL 9.7  75.6  9.7   Total Protein 6.5 - 8.1 g/dL 7.5  7.9  8.2   Total Bilirubin 0.3 - 1.2 mg/dL 0.4  0.3  0.4   Alkaline Phos 38 - 126 U/L 103  94  101   AST 15 - 41 U/L 20  25  15    ALT 0 - 44 U/L 21  27  17      RADIOGRAPHIC STUDIES: No results found.  ASSESSMENT & PLAN  Ariel Taylor 62 y.o. female with medical history significant for JAK2 positive ET who presents for a follow up visit.   # Essential Thrombocytosis, JAK2 positive -- on aspirin alone. -- No new B symptoms. -No concerns on physical exam.  Given her current age of 71, we have discussed about role of Hydrea which is a cytoreductive agent in the treatment of high risk essential thrombocytosis.  I have discussed the mechanism of action, adverse effects of Hydrea including but not limited to fatigue, ankle swelling, mouth ulcers, skin rash, myelosuppression.  She is not interested in pursuing Hydrea at this time.  She would like to continue baby aspirin.  She understands that with addition of Hydrea there is reduced risk of cardiac events.  But she would like to defer it for now.  She wants to return to clinic in 3 to 4 months for follow-up.  #Microcytic  hypochromic anemia.  alpha thalassemia trait confirmed and hence the mild anemia with microcytosis.  She has no children.  We have discussed that this is a carrier state and this should not have any impact on her survival.  I will repeat iron panel and ferritin today.  Will keep her posted if she continues to be iron deficient.  Orders Placed This Encounter  Procedures   MM 3D SCREENING MAMMOGRAM BILATERAL BREAST    Standing Status:   Future    Standing Expiration Date:   06/24/2024    Order Specific Question:   Reason for Exam (SYMPTOM  OR DIAGNOSIS REQUIRED)    Answer:   History of breast cancer    Order Specific Question:   Preferred imaging location?    Answer:   [facility]    Order Specific Question:   Is the patient pregnant?    Answer:   No   Iron and Iron Binding Capacity ([facility] only)    Standing Status:   Future    Standing Expiration Date:   06/24/2024   Ferritin    Standing Status:   Future    Standing Expiration Date:   06/24/2024    Rachel Moulds MD   06/25/2023 10:11 AM

## 2023-08-15 ENCOUNTER — Telehealth: Payer: Self-pay | Admitting: Hematology and Oncology

## 2023-08-15 NOTE — Telephone Encounter (Signed)
Called patient to confirm 4 month follow up.

## 2023-09-17 ENCOUNTER — Ambulatory Visit
Admission: RE | Admit: 2023-09-17 | Discharge: 2023-09-17 | Disposition: A | Discharge status: Home / Self Care | Payer: BC Managed Care – PPO | Source: Ambulatory Visit | Attending: Hematology and Oncology

## 2023-09-17 DIAGNOSIS — Z1231 Encounter for screening mammogram for malignant neoplasm of breast: Secondary | ICD-10-CM

## 2023-12-13 ENCOUNTER — Other Ambulatory Visit: Payer: Self-pay

## 2023-12-13 ENCOUNTER — Ambulatory Visit: Payer: Self-pay | Admitting: Hematology and Oncology

## 2023-12-14 ENCOUNTER — Telehealth: Payer: Self-pay

## 2023-12-14 ENCOUNTER — Other Ambulatory Visit: Payer: Self-pay

## 2023-12-14 DIAGNOSIS — D509 Iron deficiency anemia, unspecified: Secondary | ICD-10-CM

## 2023-12-14 NOTE — Telephone Encounter (Signed)
 Left message on voicemail.

## 2023-12-17 ENCOUNTER — Inpatient Hospital Stay: Payer: Self-pay

## 2023-12-17 ENCOUNTER — Inpatient Hospital Stay: Payer: Self-pay | Admitting: Hematology and Oncology

## 2024-03-21 ENCOUNTER — Telehealth: Payer: Self-pay

## 2024-03-21 NOTE — Telephone Encounter (Signed)
 Left message to confirm appt for 6/30

## 2024-03-24 ENCOUNTER — Inpatient Hospital Stay: Admitting: Hematology and Oncology

## 2024-03-24 ENCOUNTER — Inpatient Hospital Stay: Attending: Hematology and Oncology

## 2024-03-24 ENCOUNTER — Other Ambulatory Visit: Payer: Self-pay | Admitting: Hematology and Oncology

## 2024-03-24 DIAGNOSIS — D473 Essential (hemorrhagic) thrombocythemia: Secondary | ICD-10-CM

## 2024-03-24 DIAGNOSIS — D509 Iron deficiency anemia, unspecified: Secondary | ICD-10-CM

## 2024-03-24 NOTE — Progress Notes (Deleted)
 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   Interval History:  Ameirah Khatoon 63 y.o. female with medical history significant for JAK2 positive ET who presents for a follow up.  She lives in [place] but would like to continue follow-up in [place].      MEDICAL HISTORY:  Past Medical History:  Diagnosis Date   Diabetes mellitus without complication (HCC)    Hyperlipidemia    Hypertension    Mild atherosclerosis of carotid artery, left     SURGICAL HISTORY: Past Surgical History:  Procedure Laterality Date   ABDOMINAL HYSTERECTOMY      SOCIAL HISTORY: Social History   Socioeconomic History   Marital status: Single    Spouse name: Not on file   Number of children: Not on file   Years of education: Not on file   Highest education level: Not on file  Occupational History   Not on file  Tobacco Use   Smoking status: Never   Smokeless tobacco: Never  Vaping Use   Vaping status: Never Used  Substance and Sexual Activity   Alcohol use: No    Alcohol/week: 0.0 standard drinks of alcohol   Drug use: No   Sexual activity: Not on file  Other Topics Concern   Not on file  Social History Narrative   Not on file   Social Drivers of Health   Financial Resource Strain: Not on file  Food Insecurity: Not on file  Transportation Needs: Not on file  Physical Activity: Not on file  Stress: Not on file  Social Connections: Not on file  Intimate Partner Violence: Not on file    FAMILY HISTORY: Family History  Problem Relation Age of Onset   Diabetes Other    Diabetes Mother     ALLERGIES:  is allergic to acetaminophen.  MEDICATIONS:  Current Outpatient Medications  Medication Sig Dispense Refill   amLODipine  (NORVASC) 5 MG tablet Take 5 mg by mouth daily.     ferrous sulfate  325 (65 FE) MG tablet Take 1 tablet (325 mg total) by mouth daily with breakfast. Please take with a source of Vitamin C 90 tablet 3   lisinopril (PRINIVIL,ZESTRIL) 10 MG tablet Take 10 mg by mouth daily.     metFORMIN (GLUCOPHAGE) 500 MG tablet Take 1 tablet by mouth 2 (two) times daily.     rosuvastatin (CRESTOR) 5 MG tablet Take 5 mg by mouth daily.     No current facility-administered medications for this visit.    PHYSICAL EXAMINATION:  There were no vitals filed for this visit.    There were no vitals filed for this visit.  Physical Exam Constitutional:      Appearance: Normal appearance.   Cardiovascular:     Rate and Rhythm: Normal rate and regular rhythm.     Pulses: Normal pulses.     Heart sounds: Normal heart sounds.  Pulmonary:     Effort: Pulmonary effort is normal.     Breath sounds: Normal breath sounds.  Abdominal:     General: Abdomen is flat.     Palpations: Abdomen is soft.   Musculoskeletal:        General: No swelling.     Cervical back: Normal range of motion. No rigidity.  Lymphadenopathy:     Cervical: No cervical  adenopathy.   Skin:    General: Skin is warm and dry.   Neurological:     General: No focal deficit present.     Mental Status: She is alert.       LABORATORY DATA:  I have reviewed the data as listed    Latest Ref Rng & Units 06/25/2023    8:42 AM 04/11/2023    3:28 PM 12/23/2021    8:29 AM  CBC  WBC 4.0 - 10.5 K/uL 5.5  8.5  7.3   Hemoglobin 12.0 - 15.0 g/dL 89.1  89.2  89.2   Hematocrit 36.0 - 46.0 % 34.5  34.2  34.7   Platelets 150 - 400 K/uL 418  450  459        Latest Ref Rng & Units 06/25/2023    8:42 AM 04/11/2023    3:28 PM 12/23/2021    8:29 AM  CMP  Glucose 70 - 99 mg/dL 717  878  825   BUN 6 - 20 mg/dL 10  16  15    Creatinine 0.44 - 1.00 mg/dL 9.16  9.00  9.14   Sodium 135 - 145 mmol/L 140  142  140   Potassium 3.5 - 5.1 mmol/L 4.1  4.1   3.8   Chloride 98 - 111 mmol/L 106  105  105   CO2 22 - 32 mmol/L 29  29  28    Calcium 8.9 - 10.3 mg/dL 9.7  89.8  9.7   Total Protein 6.5 - 8.1 g/dL 7.5  7.9  8.2   Total Bilirubin 0.3 - 1.2 mg/dL 0.4  0.3  0.4   Alkaline Phos 38 - 126 U/L 103  94  101   AST 15 - 41 U/L 20  25  15    ALT 0 - 44 U/L 21  27  17      RADIOGRAPHIC STUDIES: No results found.  ASSESSMENT & PLAN  Safiya Girdler 63 y.o. female with medical history significant for JAK2 positive ET who presents for a follow up visit.   # Essential Thrombocytosis, JAK2 positive -- on aspirin alone. -- No new B symptoms. -No concerns on physical exam.  Given her current age of 39, we have discussed about role of Hydrea which is a cytoreductive agent in the treatment of high risk essential thrombocytosis.  I have discussed the mechanism of action, adverse effects of Hydrea including but not limited to fatigue, ankle swelling, mouth ulcers, skin rash, myelosuppression.  She is not interested in pursuing Hydrea at this time.  She would like to continue baby aspirin.  She understands that with addition of Hydrea there is reduced risk of cardiac events.  But she would like to defer it for now.  She wants to return to clinic in 3 to 4 months for follow-up.  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  She has no children.  We have discussed that this is a carrier state and this should not have any impact on her survival.  I will repeat iron panel and ferritin today.  Will keep her posted if she continues to be iron deficient.  No orders of the defined types were placed in this encounter.   Amber Stalls MD   03/24/2024 7:56 AM
